# Patient Record
Sex: Male | Born: 1965 | Race: White | Hispanic: No | Marital: Single | Smoking: Former smoker
Health system: Southern US, Community
[De-identification: ages and names within clinical notes are randomized; demographics above are authoritative.]

## PROBLEM LIST (undated history)

## (undated) DIAGNOSIS — F250 Schizoaffective disorder, bipolar type: Secondary | ICD-10-CM

## (undated) DIAGNOSIS — J45909 Unspecified asthma, uncomplicated: Secondary | ICD-10-CM

## (undated) DIAGNOSIS — F32A Depression, unspecified: Secondary | ICD-10-CM

## (undated) DIAGNOSIS — F319 Bipolar disorder, unspecified: Secondary | ICD-10-CM

## (undated) DIAGNOSIS — K769 Liver disease, unspecified: Secondary | ICD-10-CM

## (undated) DIAGNOSIS — F419 Anxiety disorder, unspecified: Secondary | ICD-10-CM

## (undated) DIAGNOSIS — F329 Major depressive disorder, single episode, unspecified: Secondary | ICD-10-CM

## (undated) DIAGNOSIS — F259 Schizoaffective disorder, unspecified: Secondary | ICD-10-CM

## (undated) DIAGNOSIS — E7800 Pure hypercholesterolemia, unspecified: Secondary | ICD-10-CM

## (undated) DIAGNOSIS — C801 Malignant (primary) neoplasm, unspecified: Secondary | ICD-10-CM

## (undated) DIAGNOSIS — K219 Gastro-esophageal reflux disease without esophagitis: Secondary | ICD-10-CM

## (undated) HISTORY — DX: Liver disease, unspecified: K76.9

## (undated) HISTORY — DX: Anxiety disorder, unspecified: F41.9

## (undated) HISTORY — DX: Gastro-esophageal reflux disease without esophagitis: K21.9

## (undated) HISTORY — DX: Major depressive disorder, single episode, unspecified: F32.9

## (undated) HISTORY — DX: Depression, unspecified: F32.A

## (undated) HISTORY — PX: CHEST WALL BIOPSY: SHX1338

## (undated) HISTORY — DX: Unspecified asthma, uncomplicated: J45.909

---

## 1999-12-28 ENCOUNTER — Inpatient Hospital Stay (HOSPITAL_COMMUNITY): Admission: EM | Admit: 1999-12-28 | Discharge: 1999-12-28 | Payer: Self-pay | Admitting: Emergency Medicine

## 1999-12-28 ENCOUNTER — Encounter: Payer: Self-pay | Admitting: Emergency Medicine

## 1999-12-28 ENCOUNTER — Encounter: Payer: Self-pay | Admitting: Cardiology

## 2000-01-31 ENCOUNTER — Emergency Department (HOSPITAL_COMMUNITY): Admission: EM | Admit: 2000-01-31 | Discharge: 2000-01-31 | Payer: Self-pay | Admitting: Emergency Medicine

## 2000-02-09 ENCOUNTER — Encounter: Admission: RE | Admit: 2000-02-09 | Discharge: 2000-02-09 | Payer: Self-pay | Admitting: Hematology and Oncology

## 2000-03-21 ENCOUNTER — Emergency Department (HOSPITAL_COMMUNITY): Admission: EM | Admit: 2000-03-21 | Discharge: 2000-03-21 | Payer: Self-pay | Admitting: Emergency Medicine

## 2000-04-11 ENCOUNTER — Emergency Department (HOSPITAL_COMMUNITY): Admission: EM | Admit: 2000-04-11 | Discharge: 2000-04-11 | Payer: Self-pay | Admitting: Emergency Medicine

## 2000-09-03 ENCOUNTER — Emergency Department (HOSPITAL_COMMUNITY): Admission: EM | Admit: 2000-09-03 | Discharge: 2000-09-03 | Payer: Self-pay | Admitting: Emergency Medicine

## 2000-09-03 ENCOUNTER — Encounter: Payer: Self-pay | Admitting: Emergency Medicine

## 2000-09-13 ENCOUNTER — Emergency Department (HOSPITAL_COMMUNITY): Admission: EM | Admit: 2000-09-13 | Discharge: 2000-09-13 | Payer: Self-pay | Admitting: Emergency Medicine

## 2014-11-07 ENCOUNTER — Emergency Department
Admission: EM | Admit: 2014-11-07 | Discharge: 2014-11-07 | Disposition: A | Discharge status: Home / Self Care | Payer: Medicaid Other | Attending: Student | Admitting: Student

## 2014-11-07 ENCOUNTER — Emergency Department: Payer: Medicaid Other

## 2014-11-07 ENCOUNTER — Encounter: Payer: Self-pay | Admitting: Emergency Medicine

## 2014-11-07 DIAGNOSIS — S20212A Contusion of left front wall of thorax, initial encounter: Secondary | ICD-10-CM | POA: Insufficient documentation

## 2014-11-07 DIAGNOSIS — Y9389 Activity, other specified: Secondary | ICD-10-CM | POA: Insufficient documentation

## 2014-11-07 DIAGNOSIS — Z720 Tobacco use: Secondary | ICD-10-CM | POA: Diagnosis not present

## 2014-11-07 DIAGNOSIS — Y998 Other external cause status: Secondary | ICD-10-CM | POA: Diagnosis not present

## 2014-11-07 DIAGNOSIS — W01198A Fall on same level from slipping, tripping and stumbling with subsequent striking against other object, initial encounter: Secondary | ICD-10-CM | POA: Diagnosis not present

## 2014-11-07 DIAGNOSIS — S299XXA Unspecified injury of thorax, initial encounter: Secondary | ICD-10-CM | POA: Diagnosis present

## 2014-11-07 DIAGNOSIS — Y9289 Other specified places as the place of occurrence of the external cause: Secondary | ICD-10-CM | POA: Insufficient documentation

## 2014-11-07 DIAGNOSIS — Z79899 Other long term (current) drug therapy: Secondary | ICD-10-CM | POA: Insufficient documentation

## 2014-11-07 HISTORY — DX: Bipolar disorder, unspecified: F31.9

## 2014-11-07 HISTORY — DX: Schizoaffective disorder, unspecified: F25.9

## 2014-11-07 HISTORY — DX: Schizoaffective disorder, bipolar type: F25.0

## 2014-11-07 HISTORY — DX: Pure hypercholesterolemia, unspecified: E78.00

## 2014-11-07 MED ORDER — IBUPROFEN 800 MG PO TABS: 800.0000 mg | ORAL_TABLET | Freq: Three times a day (TID) | ORAL | Status: AC | PRN

## 2014-11-07 MED ORDER — TRAMADOL HCL 50 MG PO TABS: 50.0000 mg | ORAL_TABLET | Freq: Four times a day (QID) | ORAL | Status: AC | PRN

## 2014-11-07 NOTE — ED Notes (Signed)
Fell 3 days ago  Hit left rib area pain worse with inspiration

## 2014-11-07 NOTE — Discharge Instructions (Signed)

## 2014-11-07 NOTE — ED Provider Notes (Signed)
[facility] Emergency Department Provider Note    ____________________________________________  Time seen: 1020 hrs.  I have reviewed the triage vital signs and the nursing notes.   HISTORY  Chief Complaint Fall       HPI Shane Holland is a 49 y.o. male complaining the left anterior and lateral rib pain secondary to a fall 3 days ago. Patient stated he was standing on a dumpster and he stated fell striking his anterior and lateral chest against the wall of the dumpster. Patient stated he straighten his pain as 8/10 especially with deep respirations. Patient is  pain is sharp. Patient patient denies any recent surgery.Patient stated only provocative factor is taking deep breaths. Patient denied any dyspnea.     Past Medical History  Diagnosis Date  . Bipolar 1 disorder unk  . Schizo affective schizophrenia unk  . Hypercholesteremia unk    There are no active problems to display for this patient.   History reviewed. No pertinent past surgical history.  Current Outpatient Rx  Name  Route  Sig  Dispense  Refill  . simvastatin (ZOCOR) 20 MG tablet   Oral   Take 20 mg by mouth daily.         Marland Kitchen thiothixene (NAVANE) 5 MG capsule   Oral   Take 5 mg by mouth 3 (three) times daily.         . traZODone (DESYREL) 100 MG tablet   Oral   Take 100 mg by mouth at bedtime.         Marland Kitchen zolpidem (AMBIEN) 10 MG tablet   Oral   Take 10 mg by mouth at bedtime as needed for sleep.         Marland Kitchen ibuprofen (ADVIL,MOTRIN) 800 MG tablet   Oral   Take 1 tablet (800 mg total) by mouth every 8 (eight) hours as needed for moderate pain.   15 tablet   0   . traMADol (ULTRAM) 50 MG tablet   Oral   Take 1 tablet (50 mg total) by mouth every 6 (six) hours as needed for moderate pain.   12 tablet   0     Allergies Review of patient's allergies indicates no known allergies.  No family history on file.  Social History History  Substance Use Topics   . Smoking status: Current Every Day Smoker  . Smokeless tobacco: Not on file  . Alcohol Use: No    Review of Systems  Constitutional: Negative for fever. Eyes: Negative for visual changes. ENT: Negative for sore throat. Cardiovascular: Positive for chest wall pain. Respiratory: Negative for shortness of breath. Gastrointestinal: Negative for abdominal pain, vomiting and diarrhea. Genitourinary: Negative for dysuria. Musculoskeletal: Negative for back pain. Skin: Negative for rash. Neurological: Negative for headaches, focal weakness or numbness. Psychiatric:Positive for bipolar schizophrenia. Endocrine:Negative Hematological/Lymphatic:Negative Allergic/Immunilogical: Negative  10-point ROS otherwise negative.  ____________________________________________   PHYSICAL EXAM:  VITAL SIGNS: ED Triage Vitals  Enc Vitals Group     BP 11/07/14 0953 148/77 mmHg     Pulse Rate 11/07/14 0953 67     Resp 11/07/14 0953 18     Temp 11/07/14 0953 98.5 F (36.9 C)     Temp Source 11/07/14 0953 Oral     SpO2 11/07/14 0953 99 %     Weight 11/07/14 0953 180 lb (81.647 kg)     Height 11/07/14 0953 5\' 8"  (1.727 m)     Head Cir --      Peak Flow --  Pain Score 11/07/14 0953 8     Pain Loc --      Pain Edu? --      Excl. in [place]? --      Constitutional: Alert and oriented. Well appearing and in no distress. Eyes: Conjunctivae are normal. PERRL. Normal extraocular movements. ENT   Head: Normocephalic and atraumatic.   Nose: No congestion/rhinnorhea.   Mouth/Throat: Mucous membranes are moist.   Neck: No stridor. Hematological/Lymphatic/Immunilogical: No cervical lymphadenopathy. Cardiovascular: Normal rate, regular rhythm. Normal and symmetric distal pulses are present in all extremities. No murmurs, rubs, or gallops. Respiratory: Normal respiratory effort without tachypnea nor retractions. Breath sounds are clear and equal bilaterally. No  wheezes/rales/rhonchi. Gastrointestinal: Soft and nontender. No distention. No abdominal bruits. There is no CVA tenderness. Genitourinary: Not examined Musculoskeletal: Nontender with normal range of motion in all extremities. No joint effusions.  No lower extremity tenderness nor edema. Palpation anterior and lateral chest wall. Neurologic:  Normal speech and language. No gross focal neurologic deficits are appreciated. Speech is normal. No gait instability. Skin:  Skin is warm, dry and intact. No rash noted. Psychiatric: Mood and affect are normal. Speech and behavior are normal. Patient exhibits appropriate insight and judgment.  ____________________________________________    LABS (pertinent positives/negatives)    ____________________________________________   EKG  None  ____________________________________________    RADIOLOGY  None  ____________________________________________   PROCEDURES  Procedure(s) performed: None  Critical Care performed: No  ____________________________________________   INITIAL IMPRESSION / ASSESSMENT AND PLAN / ED COURSE  Pertinent labs & imaging results that were available during my care of the patient were reviewed by me and considered in my medical decision making (see chart for details).  Negative Rib Fracture  ____________________________________________   FINAL CLINICAL IMPRESSION(S) / ED DIAGNOSES  Final diagnoses:  Rib contusion, left, initial encounter     Sable Feil, PA-C 11/07/14 [place], MD 11/07/14 2321

## 2017-07-25 LAB — LIPID PANEL
Cholesterol: 178 (ref 0–200)
HDL: 58 (ref 35–70)
LDL CALC: 101
TRIGLYCERIDES: 95 (ref 40–160)

## 2017-07-25 LAB — BASIC METABOLIC PANEL
BUN: 16 (ref 4–21)
Creatinine: 1.3 (ref 0.6–1.3)
Glucose: 107
POTASSIUM: 4.3 (ref 3.4–5.3)
SODIUM: 136 — AB (ref 137–147)

## 2017-07-25 LAB — VITAMIN D 25 HYDROXY (VIT D DEFICIENCY, FRACTURES): Vit D, 25-Hydroxy: 26

## 2017-07-25 LAB — CBC AND DIFFERENTIAL
HCT: 38 — AB (ref 41–53)
HEMOGLOBIN: 13.2 — AB (ref 13.5–17.5)
Neutrophils Absolute: 5023
Platelets: 216 (ref 150–399)
WBC: 6.9

## 2017-07-25 LAB — OTHER LAB RESULT
ALBUMIN: 5.3 — AB (ref 3.6–5.1)
FOLATE: 7.9
VITAMIN B12: 413

## 2017-07-25 LAB — HEPATIC FUNCTION PANEL
ALT: 44 — AB (ref 10–40)
AST: 62 — AB (ref 14–40)
Alkaline Phosphatase: 66 (ref 25–125)
BILIRUBIN, TOTAL: 0.6

## 2017-07-25 LAB — TSH: TSH: 20.84 — AB (ref 0.41–5.90)

## 2017-10-03 ENCOUNTER — Encounter: Payer: Self-pay | Admitting: Family Medicine

## 2017-10-03 ENCOUNTER — Ambulatory Visit (INDEPENDENT_AMBULATORY_CARE_PROVIDER_SITE_OTHER): Payer: Medicaid Other | Admitting: Family Medicine

## 2017-10-03 VITALS — BP 127/70 | HR 96 | Temp 98.4°F | Resp 16 | Ht 68.0 in | Wt 175.0 lb

## 2017-10-03 DIAGNOSIS — F5104 Psychophysiologic insomnia: Secondary | ICD-10-CM

## 2017-10-03 DIAGNOSIS — Z7689 Persons encountering health services in other specified circumstances: Secondary | ICD-10-CM | POA: Diagnosis not present

## 2017-10-03 DIAGNOSIS — F259 Schizoaffective disorder, unspecified: Secondary | ICD-10-CM

## 2017-10-03 DIAGNOSIS — E7800 Pure hypercholesterolemia, unspecified: Secondary | ICD-10-CM | POA: Insufficient documentation

## 2017-10-03 DIAGNOSIS — C7989 Secondary malignant neoplasm of other specified sites: Secondary | ICD-10-CM | POA: Insufficient documentation

## 2017-10-03 DIAGNOSIS — Z515 Encounter for palliative care: Secondary | ICD-10-CM | POA: Diagnosis not present

## 2017-10-03 DIAGNOSIS — F250 Schizoaffective disorder, bipolar type: Secondary | ICD-10-CM | POA: Diagnosis not present

## 2017-10-03 DIAGNOSIS — G4700 Insomnia, unspecified: Secondary | ICD-10-CM | POA: Insufficient documentation

## 2017-10-03 NOTE — Progress Notes (Addendum)
Subjective:    Patient ID: Shane Holland, male    DOB: 1966-03-12, 52 y.o.   MRN: [id number]  Shane Holland is a 52 y.o. male presenting on 10/03/2017 for Establish Care; Schizophrenia; and Cancer (Thymus, metastatic)  History provided by both patient and his sister, primary caregiver, Renee. About 1.5 months ago, he has been moved out of [place] in [facility], this was done when Hospice was called in and established with him. Here to establish care locally with new PCP.  Previous PCP Dr Kathaleen Grinder  HPI   Schizophrenia, schizoaffective type / Bipolar Depression Chronic history of mental health problems. Followed by Psychiatry at previous location. Has been on anti-psychotic medicine with Thiothixene, additionally has been on Trazodone for sleep. There were issues in past with difficulty obtaining Thiothixene in past at group home, had been off for few months. - He has recently attempted to re-establish with [facility] locally in [place], he had been in contact with them and had apt set up recently but then unable to establish since they were waiting on prior psych records, they were given temporary rx Thiothixene (Navane) 5mg  TID for past 2 weeks. Next apt in 1 week. - Additionally regarding medications, he is taking Trazodone nightly for insomnia, but it seems to be ineffective, he reports past meds have not been helpful for insomnia, past record shows had tried Ambien 10mg  in past - Today regarding symptoms, he has no new complaints. Consistent with outside records provided and input from family caregiver, he complains of some delusions that seem to be long-term issues for patient. He recounts same concerns today of the following concerns of his: he states that he "had his heart removed from his body through his bottom, and it was replaced with someone else's heart and now he cannot hear his own thoughts anymore, and he is requesting to have a new heart", additionally he  complains of "having urine and feces inside his body that keep him awake and cause him to have insomnia, and explains this is why the medicines don't work"   Hospice / [place], Thymus Cancer, Stage IV Metastatic Previously followed by Dr Einar Pheasant ([place]) was diagnosed in 2017, he has had prior work-up imaging, treatment with chemotherapy. He was mets to liver, bone, and lungs. He was recently admitted to hospice and palliative care, and then no longer plans to follow-up with Oncology since relocating from [facility] [place] now to [facility]. He is on comfort care. - Currently receiving weekly hospice visit from [facility], he was told they can continue services weekly until he re-establishes with new group home locally then transfer to [facility]. He is on anti-anxiety med with Ativan - He is taking Gabapentin 600mg  BID  HYPERLIPIDEMIA: - Reports concerns if needs to continue statin, was told to discuss it with doctor but hey may not need anymore. Last lipid panel 07/2017, from prior PCP, reviewed result with controlled lipids on statin - Currently taking Simvastatin 20mg  daily, tolerating well without side effects or myalgias - Taking Fish Oil Omega 3, asking if needs this as well   Depression screen PHQ 2/9 10/04/2017  Decreased Interest 2  Down, Depressed, Hopeless 1  PHQ - 2 Score 3  Altered sleeping 3  Tired, decreased energy 2  Change in appetite 1  Feeling bad or failure about yourself  1  Trouble concentrating 2  Moving slowly or fidgety/restless 3  Suicidal  thoughts 0  PHQ-9 Score 15  Difficult doing work/chores Somewhat difficult    GAD 7 : Generalized Anxiety Score 10/04/2017  Nervous, Anxious, on Edge 3  Control/stop worrying 2  Worry too much - different things 2  Trouble relaxing 3  Restless 2  Easily annoyed or irritable 1  Afraid - awful might happen 1  Total GAD 7 Score 14  Anxiety Difficulty Somewhat difficult      Past Medical History:  Diagnosis Date  . Anxiety   . Asthma   . Bipolar 1 disorder ([place]) unk  . Depression   . GERD (gastroesophageal reflux disease)   . Hypercholesteremia unk  . Liver disease   . Schizo affective schizophrenia ([place]) unk   Past Surgical History:  Procedure Laterality Date  . CHEST WALL BIOPSY     Social History   Socioeconomic History  . Marital status: Single    Spouse name: Not on file  . Number of children: Not on file  . Years of education: Not on file  . Highest education level: Not on file  Occupational History  . Not on file  Social Needs  . Financial resource strain: Not on file  . Food insecurity:    Worry: Not on file    Inability: Not on file  . Transportation needs:    Medical: Not on file    Non-medical: Not on file  Tobacco Use  . Smoking status: Current Every Day Smoker    Packs/day: 1.00  . Smokeless tobacco: Current User  Substance and Sexual Activity  . Alcohol use: Yes    Comment: in past  . Drug use: Not Currently    Types: "Crack" cocaine, Marijuana    Comment: in past  . Sexual activity: Not on file  Lifestyle  . Physical activity:    Days per week: Not on file    Minutes per session: Not on file  . Stress: Not on file  Relationships  . Social connections:    Talks on phone: Not on file    Gets together: Not on file    Attends religious service: Not on file    Active member of club or organization: Not on file    Attends meetings of clubs or organizations: Not on file    Relationship status: Not on file  . Intimate partner violence:    Fear of current or ex partner: Not on file    Emotionally abused: Not on file    Physically abused: Not on file    Forced sexual activity: Not on file  Other Topics Concern  . Not on file  Social History Narrative  . Not on file   Family History  Problem Relation Age of Onset  . Hypertension Mother   . Diabetes Sister    Current Outpatient Medications on File Prior  to Visit  Medication Sig  . DOCOSAHEXAENOIC ACID PO Take by mouth.  . gabapentin (NEURONTIN) 300 MG capsule Take 300 mg by mouth 3 (three) times daily.  Marland Kitchen LORazepam (ATIVAN) 0.5 MG tablet Take 0.5 mg by mouth every 4 (four) hours as needed for anxiety.  . ondansetron (ZOFRAN-ODT) 4 MG disintegrating tablet Take by mouth.  . thiothixene (NAVANE) 5 MG capsule Take 5 mg by mouth 3 (three) times daily.  . traZODone (DESYREL) 100 MG tablet Take 100 mg by mouth at bedtime.   No current facility-administered medications on file prior to visit.     Review of Systems  Constitutional: Positive for unexpected  weight change (wt loss due to cancer). Negative for activity change, appetite change, chills, diaphoresis, fatigue and fever.  HENT: Negative for congestion, hearing loss and sinus pressure.   Eyes: Negative for visual disturbance.  Respiratory: Negative for apnea, cough, chest tightness, shortness of breath and wheezing.   Cardiovascular: Negative for chest pain, palpitations and leg swelling.  Gastrointestinal: Negative for abdominal pain, anal bleeding, blood in stool, constipation, diarrhea, nausea and vomiting.  Endocrine: Negative for cold intolerance.  Genitourinary: Negative for difficulty urinating, dysuria, frequency and hematuria.  Musculoskeletal: Negative for arthralgias and neck pain.  Skin: Negative for rash.  Allergic/Immunologic: Negative for environmental allergies.  Neurological: Negative for dizziness, weakness, light-headedness, numbness and headaches.  Hematological: Negative for adenopathy.  Psychiatric/Behavioral: Positive for dysphoric mood and sleep disturbance. Negative for behavioral problems, self-injury and suicidal ideas. The patient is nervous/anxious.    Per HPI unless specifically indicated above     Objective:    BP 127/70   Pulse 96   Temp 98.4 F (36.9 C) (Oral)   Resp 16   Ht 5\' 8"  (1.727 m)   Wt 175 lb (79.4 kg)   BMI 26.61 kg/m   Wt Readings  from Last 3 Encounters:  10/03/17 175 lb (79.4 kg)  11/07/14 180 lb (81.6 kg)    Physical Exam  Constitutional: He is oriented to person, place, and time. He appears well-developed and well-nourished. No distress.  Well-appearing, comfortable, cooperative  HENT:  Head: Normocephalic and atraumatic.  Mouth/Throat: Oropharynx is clear and moist.  Eyes: Conjunctivae are normal. Right eye exhibits no discharge. Left eye exhibits no discharge.  Neck: Normal range of motion. Neck supple.  Cardiovascular: Normal rate, regular rhythm, normal heart sounds and intact distal pulses.  No murmur heard. Pulmonary/Chest: Effort normal.  Musculoskeletal: Normal range of motion. He exhibits no edema.  Neurological: He is alert and oriented to person, place, and time.  Skin: Skin is warm and dry. No rash noted. He is not diaphoretic. No erythema.  Chest wall with incisional scar anteriorly in midline in area of thymus from prior treatment.  Psychiatric:  Well groomed, good eye contact, normal speech but some specific chronic abnormal thoughts that he has reported to family and other providers (See HPI), no new hallucinations or abnormal perceptions. Occasionally speech can be pressured. Responds appropriately and follows commands.  Nursing note and vitals reviewed.  No results found for this or any previous visit.    Assessment & Plan:   Problem List Items Addressed This Visit    Hospice care patient    Diagnosis: metastatic thymus cancer [facility], until relocates to new group home in [place] then transfer      Insomnia    Poorly controlled chronic problem, seems secondary to mental health problems with schizoaffective disorder - Recommend to continue anti psychotic med - Follow-up with [facility] as planned, discuss insomnia further, may consider alternative med, such as Mirtazapine if has not tried alrdy may help wt loss, and may decide to adjust or discontinue Trazodone if  ineffective      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 [facility])    Concern with progressive metastatic thymus cancer S/p prior chemo [place], now on hospice / palliative care, not re-establishing with hospice Continue with [facility] until relocates new group home locally then will transfer to [facility] - Has recent updated FL2 from prior PCP 09/12/17 available      Relevant Medications  ondansetron (ZOFRAN-ODT) 4 MG disintegrating tablet   LORazepam (ATIVAN) 0.5 MG tablet   Schizo affective schizophrenia ([place]) - Primary    Chronic mental health problem with additional complications of mood/anxiety/insomnia Followed by Psychiatry previously, now re-establishing with [facility] locally, apt in 1 week, has current rx anti-psychotic Thiothixine continue this for now, defer meds to Psych - Complicated with fixed delusions about body such as removal of his heart replace with other, and urine inside his body causing insomnia       Other Visit Diagnoses    Encounter to establish care with new doctor     Requested and reviewed outside records from prior PCP, Oncology Review current hospice records upon receipt, anticipate will need transfer to [place] prior completed copy of FL2 from prior PCP from 09/12/17, to be scanned into chart - Will update FL2 at request, pending review from Psych and any med changes      Hypercholesteremia   Previously controlled lipids last panel 07/2017 On statin Reviewed med list today and recommended that he no longer needs to take Simvastatin 20mg , since concerns with on hospice and life expectancy - Discontinued Simvastatin and Omega 3 fish oil, reduce pill burden and reduce med side effects possibliity        No orders of the defined types were placed in this encounter.   Follow up plan: Return in about 6 weeks (around 11/14/2017) for Follow-up Psychiatry / Group Home / Hospice.  Nobie Putnam, [facility] 10/04/2017, 8:13 AM

## 2017-10-03 NOTE — Assessment & Plan Note (Addendum)
Chronic mental health problem with additional complications of mood/anxiety/insomnia Followed by Psychiatry previously, now re-establishing with [facility] locally, apt in 1 week, has current rx anti-psychotic Thiothixine continue this for now, defer meds to Psych - Complicated with fixed delusions about body such as removal of his heart replace with other, and urine inside his body causing insomnia

## 2017-10-03 NOTE — Assessment & Plan Note (Signed)
Previously controlled lipids last panel 07/2017 On statin Reviewed med list today and recommended that he no longer needs to take Simvastatin 20mg , since concerns with on hospice and life expectancy - Discontinued Simvastatin and Omega 3 fish oil, reduce pill burden and reduce med side effects possibliity

## 2017-10-03 NOTE — Assessment & Plan Note (Signed)
Poorly controlled chronic problem, seems secondary to mental health problems with schizoaffective disorder - Recommend to continue anti psychotic med - Follow-up with [facility] as planned, discuss insomnia further, may consider alternative med, such as Mirtazapine if has not tried alrdy may help wt loss, and may decide to adjust or discontinue Trazodone if ineffective

## 2017-10-03 NOTE — Assessment & Plan Note (Signed)
Diagnosis: metastatic thymus cancer [facility], until relocates to new group home in [place] then transfer

## 2017-10-03 NOTE — Assessment & Plan Note (Signed)
Concern with progressive metastatic thymus cancer S/p prior chemo and treatment [place], now on hospice / palliative care, not re-establishing with hospice Continue with [facility] until relocates new group home locally then will transfer to [facility] - Has recent updated FL2 from prior PCP 09/12/17 available

## 2017-10-03 NOTE — Patient Instructions (Addendum)
Thank you for coming to the office today.  Follow-up with [facility] and Hospice as planned  Recommend to STOP taking Simvastatin and Fish Oil cholesterol pills  May consider the Trazodone otherwise if not helping - discuss with Psychiatry [facility]  We will update the FL2 - once we receive fax copy from [place] with medication updated  Please schedule a Follow-up Appointment to: Return in about 6 weeks (around 11/14/2017) for Follow-up Psychiatry / Group Home / Hospice.  If you have any other questions or concerns, please feel free to call the office or send a message through [place]. You may also schedule an earlier appointment if necessary.  Additionally, you may be receiving a survey about your experience at our office within a few days to 1 week by e-mail or mail. We value your feedback.  Nobie Putnam, DO [place]

## 2017-10-04 ENCOUNTER — Ambulatory Visit (INDEPENDENT_AMBULATORY_CARE_PROVIDER_SITE_OTHER): Payer: Medicaid Other

## 2017-10-04 ENCOUNTER — Encounter: Payer: Self-pay | Admitting: Family Medicine

## 2017-10-04 DIAGNOSIS — Z111 Encounter for screening for respiratory tuberculosis: Secondary | ICD-10-CM | POA: Diagnosis not present

## 2017-10-06 ENCOUNTER — Other Ambulatory Visit: Payer: Self-pay

## 2017-10-06 LAB — TB SKIN TEST
Induration: 0 mm
TB SKIN TEST: NEGATIVE

## 2017-10-12 ENCOUNTER — Telehealth: Payer: Self-pay

## 2017-10-12 NOTE — Telephone Encounter (Signed)
Called back, I did not reach Dr Jamse Arn. But spoke with [facility] staff, who will leave message.  I reported that I agree with current switch of med and treatment plan and will follow-up on this.  Regarding the question on if he is to resume Oncology therapy for his cancer, I am not sure. This is opposite of what I was told in the office last visit. They told me that since he moved from [place], and left his previous Oncologist they were told nothing else available and they would not be re-establishing with an Oncologist locally. They were planning to continue Hospice care / Palliative instead.  I will look into this further, and patient is scheduled to return within 1 month.  I do not need call back specifically from [place]. Just need a faxed copy of their last office visit note to enter into patient's chart.  Nobie Putnam, [facility] 10/12/2017, 4:41 PM

## 2017-10-12 NOTE — Telephone Encounter (Signed)
Floria Raveling. MD (Psychitary) from [facility] called to discuss her treatment plan for the pt. She states since the medication Carolyne Littles  is not available. She will put the patient on a low dose of Abilify 2 MG once daily and increase it accordingly. She discuss the risk with the patient about starting on a medication how that could effect his liver.  He felt like treating his mental conditions was more beneficial to him.    She also stated the patient informed her he would like further evaluations for his cancer treatment. He didn't understand why it was documented in his chart per Hospice that he had declined treatment for his cancer.   Gypsy Balsam will be out of the office on Friday, but will return on Monday if you would like to f/u with her. She also stated if you need to reach her before Monday you can contact her nurse Santiago Glad.  Floria Raveling, MD [phone]

## 2017-11-01 ENCOUNTER — Ambulatory Visit (INDEPENDENT_AMBULATORY_CARE_PROVIDER_SITE_OTHER): Payer: Medicaid Other | Admitting: Family Medicine

## 2017-11-01 ENCOUNTER — Encounter: Payer: Self-pay | Admitting: Family Medicine

## 2017-11-01 VITALS — BP 122/88 | HR 118 | Temp 98.2°F | Resp 16 | Ht 68.0 in | Wt 172.0 lb

## 2017-11-01 DIAGNOSIS — C7989 Secondary malignant neoplasm of other specified sites: Secondary | ICD-10-CM | POA: Diagnosis not present

## 2017-11-01 DIAGNOSIS — Z515 Encounter for palliative care: Secondary | ICD-10-CM

## 2017-11-01 NOTE — Patient Instructions (Addendum)
Thank you for coming to the office today.  We will contact [facility] for more general information  Stay tuned for more information  As discussed I am concerned about your stage IV cancer and that this is a terminal illness, we will discuss about options for you to help improve your breathing and see if we need any imaging at this time. Hospice will likely be the one to assist with this.  Please schedule a Follow-up Appointment to: Return if symptoms worsen or fail to improve.  If you have any other questions or concerns, please feel free to call the office or send a message through [place]. You may also schedule an earlier appointment if necessary.  Additionally, you may be receiving a survey about your experience at our office within a few days to 1 week by e-mail or mail. We value your feedback.  Nobie Putnam, DO [place]

## 2017-11-01 NOTE — Progress Notes (Signed)
Subjective:    Patient ID: Shane Holland, male    DOB: 06-27-1966, 52 y.o.   MRN: [id number]  Shane Holland is a 52 y.o. male presenting on 11/01/2017 for Shortness of Breath (hospice home care wants patient to get evaluated for scan for tumor ) and Cancer  History provided both by patient, and also primarily by his sister, and primary caregiver, Renee.  He is now currently residing in [facility] in [place]  HPI   Metastatic Cancer of Thymus (Stage IV) / Hospice Patient - Last visit with me 10/03/17, for initial visit for establish care reviewed same primary problem, see prior notes for background information. - Interval update with now he has relocated to new group home in [place]. He has transferred from [facility] to [facility]. - Today patient complains of some worsening shortness of breath. He continues on his symptomatic treatments per hospice including Ativan and Morphine injections PRN, with some improvement, but still complains of dyspnea, worse with exertion. He is on supplemental O2 4.5L continuous. - He is requesting imaging for abdomen to check on status of tumors, last imaging was CT Chest in 08/2017 by previous Oncology - He is asking about treatment and cure of his cancer, however previously he has acknowledged no longer plans to f/u with Oncology as he is on hospice - His sister states that he has not been told that his cancer is terminal - Denies hemoptysis, chest pain or tightness, productive cough, wheezing, syncope or pre syncope   Depression screen [facility] 2/9 10/04/2017  Decreased Interest 2  Down, Depressed, Hopeless 1  PHQ - 2 Score 3  Altered sleeping 3  Tired, decreased energy 2  Change in appetite 1  Feeling bad or failure about yourself  1  Trouble concentrating 2  Moving slowly or fidgety/restless 3  Suicidal thoughts 0  PHQ-9 Score 15  Difficult doing work/chores Somewhat difficult    Social History    Tobacco Use  . Smoking status: Former Smoker    Packs/day: 1.00    Last attempt to quit: 10/28/2017    Years since quitting: 0.0  . Smokeless tobacco: Current User  Substance Use Topics  . Alcohol use: Yes    Comment: in past  . Drug use: Not Currently    Types: "Crack" cocaine, Marijuana    Comment: in past    Review of Systems Per HPI unless specifically indicated above     Objective:    BP 122/88   Pulse (!) 118   Temp 98.2 F (36.8 C) (Oral)   Resp 16   Ht 5\' 8"  (1.727 m)   Wt 172 lb (78 kg)   SpO2 99% Comment: patient is on 4.5 liter of oxygen  BMI 26.15 kg/m   Wt Readings from Last 3 Encounters:  11/01/17 172 lb (78 kg)  10/03/17 175 lb (79.4 kg)  11/07/14 180 lb (81.6 kg)    Physical Exam  Constitutional: He is oriented to person, place, and time. No distress.  Currently mildly ill and tired appearing, relatively comfortable on oxygen tank, cooperative, weight loss within past 1 month  HENT:  Head: Normocephalic and atraumatic.  Mouth/Throat: Oropharynx is clear and moist.  Eyes: Conjunctivae are normal. Right eye exhibits no discharge. Left eye exhibits no discharge.  Neck: Normal range of motion. Neck supple.  Cardiovascular: Regular rhythm, normal heart sounds and intact distal pulses.  No murmur heard. tachycardic  Pulmonary/Chest: Effort normal. No  respiratory distress. He has decreased breath sounds in the left middle field and the left lower field. He has rhonchi in the right lower field.  4.5 L via O2 tank, [place]  Musculoskeletal: Normal range of motion. He exhibits no edema.  Neurological: He is alert and oriented to person, place, and time.  Skin: Skin is warm and dry. No rash noted. He is not diaphoretic. No erythema.  Chest wall with incisional scar anteriorly in midline in area of thymus from prior treatment.  Psychiatric:  Well groomed, good eye contact, normal speech has rare abnormal thoughts, no new hallucinations or abnormal perceptions.  Responds appropriately and follows commands. He has poor insight into his current medical condition with cancer  Nursing note and vitals reviewed.  Results for orders placed or performed in visit on 10/04/17  CBC and differential  Result Value Ref Range   Hemoglobin 13.2 (A) 13.5 - 17.5   HCT 38 (A) 41 - 53   Neutrophils Absolute 5,023    Platelets 216 150 - 399   WBC 6.9   VITAMIN D 25 Hydroxy (Vit-D Deficiency, Fractures)  Result Value Ref Range   Vit D, 25-Hydroxy 26   Basic metabolic panel  Result Value Ref Range   Glucose 107    BUN 16 4 - 21   Creatinine 1.3 0.6 - 1.3   Potassium 4.3 3.4 - 5.3   Sodium 136 (A) 137 - 147  Lipid panel  Result Value Ref Range   Triglycerides 95 40 - 160   Cholesterol 178 0 - 200   HDL 58 35 - 70   LDL Cholesterol 101   Hepatic function panel  Result Value Ref Range   Alkaline Phosphatase 66 25 - 125   ALT 44 (A) 10 - 40   AST 62 (A) 14 - 40   Bilirubin, Total 0.6   TSH  Result Value Ref Range   TSH 20.84 (A) 0.41 - 5.90  Other/Misc lab result  Result Value Ref Range   VITAMIN B12 413    Folate 7.9    Albumin 5.3 (A) 3.6 - 5.1      Assessment & Plan:   Problem List Items Addressed This Visit    Hospice care patient   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 (HCC) - Primary   Relevant Medications   prochlorperazine (COMPAZINE) 10 MG tablet      Clinically with progressive metastatic cancer, thymus Stage IV Now more symptomatic with wt loss in 1 month. Progressive dyspnea, on higher O2 requirement continuous Improved temporarily on PRN with Ativan and Morphine per Hospice Patient has poor insight into terminal nature of his cancer, and he is still asking for treatment, however we discussed specifically today that his illness is terminal and our goal is to make him comfortable Now established with [facility], will contact them this week to review plan of care and discuss if any imaging planned or other treatments  available Follow-up as planned in 2 weeks with his Group Home present, reviewed current hospice meds here today  No orders of the defined types were placed in this encounter.   Follow up plan: Return if symptoms worsen or fail to improve.  A total of >25 minutes was spent face-to-face with this patient. Greater than 50% of this time was spent in counseling on his metastatic cancer diagnosis and complications of this sequela and the terminal nature of this condition and coordination of care with the patient, patient's hospice provider liberty to be  contacted and review current plan of care and discuss future concerns regarding possible imaging testing.  Nobie Putnam, DO [facility] 11/02/2017, 1:17 AM

## 2017-11-02 ENCOUNTER — Telehealth: Payer: Self-pay | Admitting: Family Medicine

## 2017-11-02 ENCOUNTER — Inpatient Hospital Stay: Payer: Medicaid Other

## 2017-11-02 ENCOUNTER — Encounter: Payer: Self-pay | Admitting: Family Medicine

## 2017-11-02 ENCOUNTER — Inpatient Hospital Stay
Admission: EM | Admit: 2017-11-02 | Discharge: 2017-11-03 | DRG: 844 | Disposition: A | Discharge status: Hospice | Payer: Medicaid Other | Attending: Internal Medicine | Admitting: Internal Medicine

## 2017-11-02 ENCOUNTER — Other Ambulatory Visit: Payer: Self-pay

## 2017-11-02 ENCOUNTER — Encounter: Payer: Self-pay | Admitting: Emergency Medicine

## 2017-11-02 ENCOUNTER — Emergency Department: Payer: Medicaid Other

## 2017-11-02 DIAGNOSIS — Z9221 Personal history of antineoplastic chemotherapy: Secondary | ICD-10-CM | POA: Diagnosis not present

## 2017-11-02 DIAGNOSIS — Z8249 Family history of ischemic heart disease and other diseases of the circulatory system: Secondary | ICD-10-CM

## 2017-11-02 DIAGNOSIS — Z9981 Dependence on supplemental oxygen: Secondary | ICD-10-CM

## 2017-11-02 DIAGNOSIS — F17290 Nicotine dependence, other tobacco product, uncomplicated: Secondary | ICD-10-CM | POA: Diagnosis present

## 2017-11-02 DIAGNOSIS — J910 Malignant pleural effusion: Secondary | ICD-10-CM | POA: Diagnosis present

## 2017-11-02 DIAGNOSIS — Z66 Do not resuscitate: Secondary | ICD-10-CM | POA: Diagnosis present

## 2017-11-02 DIAGNOSIS — R0602 Shortness of breath: Secondary | ICD-10-CM

## 2017-11-02 DIAGNOSIS — F250 Schizoaffective disorder, bipolar type: Secondary | ICD-10-CM | POA: Diagnosis present

## 2017-11-02 DIAGNOSIS — R0902 Hypoxemia: Secondary | ICD-10-CM | POA: Diagnosis present

## 2017-11-02 DIAGNOSIS — R000 Tachycardia, unspecified: Secondary | ICD-10-CM | POA: Diagnosis present

## 2017-11-02 DIAGNOSIS — K219 Gastro-esophageal reflux disease without esophagitis: Secondary | ICD-10-CM | POA: Diagnosis present

## 2017-11-02 DIAGNOSIS — J90 Pleural effusion, not elsewhere classified: Secondary | ICD-10-CM | POA: Diagnosis present

## 2017-11-02 DIAGNOSIS — Z23 Encounter for immunization: Secondary | ICD-10-CM

## 2017-11-02 DIAGNOSIS — Z833 Family history of diabetes mellitus: Secondary | ICD-10-CM

## 2017-11-02 DIAGNOSIS — Z9889 Other specified postprocedural states: Secondary | ICD-10-CM

## 2017-11-02 DIAGNOSIS — C37 Malignant neoplasm of thymus: Secondary | ICD-10-CM | POA: Diagnosis present

## 2017-11-02 DIAGNOSIS — Z515 Encounter for palliative care: Secondary | ICD-10-CM | POA: Diagnosis present

## 2017-11-02 HISTORY — DX: Malignant (primary) neoplasm, unspecified: C80.1

## 2017-11-02 LAB — CBC WITH DIFFERENTIAL/PLATELET
Basophils Absolute: 0.1 10*3/uL (ref 0–0.1)
Basophils Relative: 1 %
Eosinophils Absolute: 0.1 10*3/uL (ref 0–0.7)
Eosinophils Relative: 1 %
HEMATOCRIT: 38 % — AB (ref 40.0–52.0)
HEMOGLOBIN: 13.2 g/dL (ref 13.0–18.0)
LYMPHS ABS: 0.5 10*3/uL — AB (ref 1.0–3.6)
LYMPHS PCT: 4 %
MCH: 33.1 pg (ref 26.0–34.0)
MCHC: 34.8 g/dL (ref 32.0–36.0)
MCV: 95.2 fL (ref 80.0–100.0)
MONOS PCT: 9 %
Monocytes Absolute: 1.1 10*3/uL — ABNORMAL HIGH (ref 0.2–1.0)
NEUTROS ABS: 10.1 10*3/uL — AB (ref 1.4–6.5)
Neutrophils Relative %: 85 %
Platelets: 303 10*3/uL (ref 150–440)
RBC: 4 MIL/uL — ABNORMAL LOW (ref 4.40–5.90)
RDW: 13 % (ref 11.5–14.5)
WBC: 11.8 10*3/uL — ABNORMAL HIGH (ref 3.8–10.6)

## 2017-11-02 LAB — BODY FLUID CELL COUNT WITH DIFFERENTIAL
EOS FL: 3 %
Lymphs, Fluid: 54 %
MONOCYTE-MACROPHAGE-SEROUS FLUID: 25 %
Neutrophil Count, Fluid: 18 %
Total Nucleated Cell Count, Fluid: 834 cu mm

## 2017-11-02 LAB — ALBUMIN, PLEURAL OR PERITONEAL FLUID: Albumin, Fluid: 2.4 g/dL

## 2017-11-02 LAB — PROTIME-INR
INR: 1.12
PROTHROMBIN TIME: 14.3 s (ref 11.4–15.2)

## 2017-11-02 LAB — BASIC METABOLIC PANEL
Anion gap: 10 (ref 5–15)
BUN: 23 mg/dL — AB (ref 6–20)
CHLORIDE: 91 mmol/L — AB (ref 101–111)
CO2: 28 mmol/L (ref 22–32)
Calcium: 9.1 mg/dL (ref 8.9–10.3)
Creatinine, Ser: 0.75 mg/dL (ref 0.61–1.24)
GFR calc Af Amer: >60 mL/min (ref >60)
GFR calc non Af Amer: >60 mL/min (ref >60)
GLUCOSE: 128 mg/dL — AB (ref 65–99)
POTASSIUM: 4.6 mmol/L (ref 3.5–5.1)
Sodium: 129 mmol/L — ABNORMAL LOW (ref 135–145)

## 2017-11-02 LAB — PROTEIN, PLEURAL OR PERITONEAL FLUID: TOTAL PROTEIN, FLUID: 4.1 g/dL

## 2017-11-02 LAB — TROPONIN I: Troponin I: <0.03 ng/mL

## 2017-11-02 LAB — ALBUMIN: Albumin: 3.1 g/dL — ABNORMAL LOW (ref 3.5–5.0)

## 2017-11-02 LAB — LACTATE DEHYDROGENASE, PLEURAL OR PERITONEAL FLUID: LD FL: 564 U/L — AB (ref 3–23)

## 2017-11-02 LAB — TSH: TSH: 1.309 u[IU]/mL (ref 0.350–4.500)

## 2017-11-02 LAB — LACTATE DEHYDROGENASE: LDH: 294 U/L — ABNORMAL HIGH (ref 98–192)

## 2017-11-02 LAB — APTT: APTT: 35 s (ref 24–36)

## 2017-11-02 MED ORDER — GABAPENTIN 300 MG PO CAPS
300.0000 mg | ORAL_CAPSULE | Freq: Three times a day (TID) | ORAL | Status: DC
  Administered 2017-11-02 – 2017-11-03 (×3): 300 mg via ORAL
  Filled 2017-11-02 (×3): qty 1

## 2017-11-02 MED ORDER — ALBUTEROL SULFATE (2.5 MG/3ML) 0.083% IN NEBU: 2.5000 mg | INHALATION_SOLUTION | RESPIRATORY_TRACT | Status: DC | PRN

## 2017-11-02 MED ORDER — ACETAMINOPHEN 325 MG PO TABS: 650.0000 mg | ORAL_TABLET | Freq: Four times a day (QID) | ORAL | Status: DC | PRN

## 2017-11-02 MED ORDER — ONDANSETRON HCL 4 MG PO TABS: 4.0000 mg | ORAL_TABLET | Freq: Four times a day (QID) | ORAL | Status: DC | PRN

## 2017-11-02 MED ORDER — MORPHINE SULFATE (CONCENTRATE) 10 MG/0.5ML PO SOLN: 5.0000 mg | ORAL | Status: DC | PRN

## 2017-11-02 MED ORDER — ACETAMINOPHEN 650 MG RE SUPP: 650.0000 mg | Freq: Four times a day (QID) | RECTAL | Status: DC | PRN

## 2017-11-02 MED ORDER — METHYLPREDNISOLONE SODIUM SUCC 125 MG IJ SOLR
125.0000 mg | Freq: Once | INTRAMUSCULAR | Status: AC
  Administered 2017-11-02: 125 mg via INTRAVENOUS
  Filled 2017-11-02: qty 2

## 2017-11-02 MED ORDER — POLYETHYLENE GLYCOL 3350 17 G PO PACK: 17.0000 g | PACK | Freq: Every day | ORAL | Status: DC | PRN

## 2017-11-02 MED ORDER — PANTOPRAZOLE SODIUM 40 MG PO TBEC
40.0000 mg | DELAYED_RELEASE_TABLET | Freq: Every day | ORAL | Status: DC
  Administered 2017-11-02 – 2017-11-03 (×2): 40 mg via ORAL
  Filled 2017-11-02 (×2): qty 1

## 2017-11-02 MED ORDER — IPRATROPIUM-ALBUTEROL 0.5-2.5 (3) MG/3ML IN SOLN
3.0000 mL | Freq: Once | RESPIRATORY_TRACT | Status: AC
  Administered 2017-11-02: 3 mL via RESPIRATORY_TRACT

## 2017-11-02 MED ORDER — PNEUMOCOCCAL VAC POLYVALENT 25 MCG/0.5ML IJ INJ
0.5000 mL | INJECTION | INTRAMUSCULAR | Status: AC
  Administered 2017-11-03: 0.5 mL via INTRAMUSCULAR
  Filled 2017-11-02: qty 0.5

## 2017-11-02 MED ORDER — DOCUSATE SODIUM 100 MG PO CAPS
100.0000 mg | ORAL_CAPSULE | Freq: Two times a day (BID) | ORAL | Status: DC
  Administered 2017-11-02 – 2017-11-03 (×2): 100 mg via ORAL
  Filled 2017-11-02 (×2): qty 1

## 2017-11-02 MED ORDER — METAXALONE 800 MG PO TABS
400.0000 mg | ORAL_TABLET | Freq: Three times a day (TID) | ORAL | Status: DC
  Administered 2017-11-02 – 2017-11-03 (×2): 400 mg via ORAL
  Filled 2017-11-02 (×5): qty 0.5

## 2017-11-02 MED ORDER — LORAZEPAM 0.5 MG PO TABS: 0.5000 mg | ORAL_TABLET | ORAL | Status: DC | PRN

## 2017-11-02 MED ORDER — ENOXAPARIN SODIUM 40 MG/0.4ML ~~LOC~~ SOLN
40.0000 mg | SUBCUTANEOUS | Status: DC
  Administered 2017-11-02: 21:00:00 40 mg via SUBCUTANEOUS
  Filled 2017-11-02: qty 0.4

## 2017-11-02 MED ORDER — ONDANSETRON HCL 4 MG/2ML IJ SOLN: 4.0000 mg | Freq: Four times a day (QID) | INTRAMUSCULAR | Status: DC | PRN

## 2017-11-02 MED ORDER — SODIUM CHLORIDE 0.9 % IV BOLUS
1000.0000 mL | Freq: Once | INTRAVENOUS | Status: AC
  Administered 2017-11-02: 1000 mL via INTRAVENOUS

## 2017-11-02 MED ORDER — IPRATROPIUM-ALBUTEROL 0.5-2.5 (3) MG/3ML IN SOLN
RESPIRATORY_TRACT | Status: AC
  Administered 2017-11-02: 3 mL
  Filled 2017-11-02: qty 9

## 2017-11-02 NOTE — Procedures (Addendum)
Interventional Radiology Procedure Note  Procedure: US guided left thoracentesis  Complications: None  Estimated Blood Loss: None  Findings: 2.9 L of grossly bloody fluid removed from left pleural space.  Venetia Night. Kathlene Cote, M.D Pager:  [phone]

## 2017-11-02 NOTE — Care Management (Signed)
Message/H & P sent to [facility] as patient is open to their service. RNCM consult for DME.

## 2017-11-02 NOTE — ED Provider Notes (Signed)
[facility] Emergency Department Provider Note ____________________________________________   First MD Initiated Contact with Patient 11/02/17 [phone]     (approximate)  I have reviewed the triage vital signs and the nursing notes.   HISTORY  Chief Complaint Shortness of Breath    HPI Shane Holland is a 52 y.o. male with PMH as noted below as well as history of metastatic thymic cancer currently on hospice and palliative care who presents with shortness of breath, gradual onset, occurring over the last day, and associated with wheezing.  Patient denies fever, cough, chest pain, or GI symptoms.  He states he has had problems with his O2 at home (he is on 4 L normally).  Past Medical History:  Diagnosis Date  . Anxiety   . Asthma   . Bipolar 1 disorder ([place]) unk  . Depression   . GERD (gastroesophageal reflux disease)   . Hypercholesteremia unk  . Liver disease   . Schizo affective schizophrenia [facility]) unk    Patient Active Problem List   Diagnosis Date Noted  . Schizo affective schizophrenia ([place]) 10/03/2017  . Insomnia 10/03/2017  . 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 ([place]) 10/03/2017  . Hospice care patient 10/03/2017  . Hypercholesteremia 10/03/2017    Past Surgical History:  Procedure Laterality Date  . CHEST WALL BIOPSY      Prior to Admission medications   Medication Sig Start Date End Date Taking? Authorizing Provider  ARIPiprazole (ABILIFY) 2 MG tablet Take 2 mg by mouth daily.    [provider]  DOCOSAHEXAENOIC ACID PO Take by mouth.    [provider]  gabapentin (NEURONTIN) 300 MG capsule Take 300 mg by mouth 3 (three) times daily. 09/26/17 09/26/18  [provider]  Heparin Lock Flush (HEPARIN FLUSH, PORCINE,) 100 UNIT/ML injection Inject into the vein.    [provider]  LORazepam (ATIVAN) 0.5 MG tablet Take 0.5 mg by mouth every 4 (four) hours as needed for anxiety.    [provider]    naproxen sodium (ALEVE) 220 MG tablet Take 220 mg by mouth 2 (two) times daily as needed.    [provider]  Omega-3 1000 MG CAPS Take by mouth.    [provider]  omeprazole (PRILOSEC) 40 MG capsule Take 40 mg by mouth daily.    [provider]  ondansetron (ZOFRAN-ODT) 4 MG disintegrating tablet Take by mouth.    [provider]  prochlorperazine (COMPAZINE) 10 MG tablet Take by mouth.    [provider]  thiothixene (NAVANE) 5 MG capsule Take 5 mg by mouth 3 (three) times daily.    [provider]  traZODone (DESYREL) 100 MG tablet Take 100 mg by mouth at bedtime.    [provider]  zolpidem (AMBIEN) 10 MG tablet Take by mouth.    [provider]    Allergies Patient has no known allergies.  Family History  Problem Relation Age of Onset  . Hypertension Mother   . Diabetes Sister     Social History Social History   Tobacco Use  . Smoking status: Former Smoker    Packs/day: 1.00    Last attempt to quit: 10/28/2017    Years since quitting: 0.0  . Smokeless tobacco: Current User  Substance Use Topics  . Alcohol use: Yes    Comment: in past  . Drug use: Not Currently    Types: "Crack" cocaine, Marijuana    Comment: in past    Review of Systems  Constitutional: No fever. Eyes: No redness. ENT: No sore throat. Cardiovascular: Denies chest pain. Respiratory: Positive for shortness of breath. Gastrointestinal: No vomiting.  No diarrhea.  Genitourinary: Negative for dysuria.  Musculoskeletal: Negative for back pain. Skin: Negative for rash. Neurological: Negative for headache.   ____________________________________________   PHYSICAL EXAM:  VITAL SIGNS: ED Triage Vitals  Enc Vitals Group     BP 11/02/17 0807 104/79     Pulse Rate 11/02/17 0807 (!) 119     Resp 11/02/17 0807 16     Temp 11/02/17 0807 98.2 F (36.8 C)     Temp Source 11/02/17 0807 Oral     SpO2 11/02/17 0807 100 %      Weight 11/02/17 0810 172 lb (78 kg)     Height 11/02/17 0810 5\' 8"  (1.727 m)     Head Circumference --      Peak Flow --      Pain Score 11/02/17 0808 0     Pain Loc --      Pain Edu? --      Excl. in [place]? --     Constitutional: Alert and oriented.  Slightly uncomfortable appearing but in no acute distress. Eyes: Conjunctivae are normal.  Head: Atraumatic. Nose: No congestion/rhinnorhea. Mouth/Throat: Mucous membranes are somewhat dry.   Neck: Normal range of motion.  Cardiovascular: Tachycardic, regular rhythm. Grossly normal heart sounds.  Good peripheral circulation. Respiratory: Slightly increased respiratory effort.  No retractions.  Bilateral wheezing with decreased breath sounds on left. Gastrointestinal: Soft and nontender. No distention.  Genitourinary: No flank tenderness. Musculoskeletal: No lower extremity edema.  Extremities warm and well perfused.  Neurologic:  Normal speech and language. No gross focal neurologic deficits are appreciated.  Skin:  Skin is warm and dry. No rash noted. Psychiatric: Mood and affect are normal. Speech and behavior are normal.  ____________________________________________   LABS (all labs ordered are listed, but only abnormal results are displayed)  Labs Reviewed  BASIC METABOLIC PANEL - Abnormal; Notable for the following components:      Result Value   Sodium 129 (*)    Chloride 91 (*)    Glucose, Bld 128 (*)    BUN 23 (*)    All other components within normal limits  CBC WITH DIFFERENTIAL/PLATELET - Abnormal; Notable for the following components:   WBC 11.8 (*)    RBC 4.00 (*)    HCT 38.0 (*)    Neutro Abs 10.1 (*)    Lymphs Abs 0.5 (*)    Monocytes Absolute 1.1 (*)    All other components within normal limits  TROPONIN I   ____________________________________________  EKG  ED ECG REPORT I, Arta Silence, the attending physician, personally viewed and interpreted this ECG.  Date: 11/02/2017 EKG Time: 803 Rate:  119 Rhythm: Sinus tachycardia QRS Axis: normal Intervals: normal ST/T Wave abnormalities: normal Narrative Interpretation: no evidence of acute ischemia  ____________________________________________  RADIOLOGY  CXR: Total opacity of left lung, atelectasis versus effusion  ____________________________________________   PROCEDURES  Procedure(s) performed: No  Procedures  Critical Care performed: No ____________________________________________   INITIAL IMPRESSION / ASSESSMENT AND PLAN / ED COURSE  Pertinent labs & imaging results that were available during my care of the patient were reviewed by me and considered in my medical decision making (see chart for details).  52 year old male with PMH as noted above including history of metastatic thymic cancer who is currently on hospice and palliative care presents with worsening shortness of breath over the course  the day today despite 4 L of O2 at home.  It is associated with wheezing but no fever.  I reviewed the past medical records in Epic and confirmed the patient's cancer history.  No recent other admissions.  On exam, he is uncomfortable appearing but in no acute respiratory distress.  He has significant bilateral wheezing.  The remainder the exam is as described above.  Differential includes pneumonia, acute bronchitis, pleural effusion, obstruction related to the mass, or less likely cardiac etiology.  Plan: Nebs, steroids, chest x-ray, labs, and reassess.    ----------------------------------------- 10:50 AM on 11/02/2017 -----------------------------------------  Patient reports of improved symptoms after nebs and steroid.  X-ray reveals opacification of his entire left lung, consistent with likely effusion which appears to be new.  Although patient is on hospice given that he is relatively functional I believe that he would benefit from potential thoracentesis as well as continue treatment for likely bronchitis, so  we will admit him to the hospital.  I signed the patient out to the hospitalist Dr. Darvin Neighbours.  ____________________________________________   FINAL CLINICAL IMPRESSION(S) / ED DIAGNOSES  Final diagnoses:  Pleural effusion  Hypoxia  Shortness of breath      NEW MEDICATIONS STARTED DURING THIS VISIT:  New Prescriptions   No medications on file     Note:  This document was prepared using Dragon voice recognition software and may include unintentional dictation errors.    Arta Silence, MD 11/02/17 1050

## 2017-11-02 NOTE — ED Notes (Signed)
Pt taken to floor in [facility] by RN at this time. VSS. Report called to floor. All questions answered.

## 2017-11-02 NOTE — ED Triage Notes (Signed)
Pt to ED with c/o of increased difficulty breathing. Pt is a cancer pt sent by hospice for eval. Pt on chronic O2 at 4 L.

## 2017-11-02 NOTE — Telephone Encounter (Signed)
Received notice today from [facility] ED that patient was seen for worsening dyspnea today and higher O2 req and ultimately had x-ray showed significant L sided pleural effusion. He was admitted to hospitalist for further treatment and likely drainage.  I called patient's sister Joseph Art to review this course, as we just discussed his care yesterday and reviewed prompt follow-up recommendations if his breathing were to worsen, which she has followed.  She updated me that his current [facility] plans to discharge him while in hospital and they would readmit after. She would like to transfer hospice providers to [facility], and also for the benefit of access to the hospice home towards end of his life if needed. She prefers to take him out of group home most likely in future.  I advised her that typically we send outpatient fax/referral for [facility], but since he is inpatient currently, they should be able to discharge him directly into [facility].  I called [facility] and reviewed this with them, and they will reach out to their hospital liaison and coordinate his admission to their hospice service upon discharge. They have his demographics and sister's contact information. I have sent staff message to admitting hospitalist as well to help coordinate.  Nobie Putnam, DO [facility] 11/02/2017, 12:44 PM

## 2017-11-02 NOTE — H&P (Signed)
[facility] [place] NAME: Shane Holland    MR#:  [id number]  DATE OF BIRTH:  29-Jun-1966  DATE OF ADMISSION:  11/02/2017  PRIMARY CARE PHYSICIAN: Olin Hauser, DO   REQUESTING/REFERRING PHYSICIAN: Dr. Cherylann Banas  CHIEF COMPLAINT:   Chief Complaint  Patient presents with  . Shortness of Breath    HISTORY OF PRESENT ILLNESS:  Shane Holland  is a 52 y.o. male with a known history of stage IV thymic cancer, schizoaffective disorder, bipolar disorder on hospice in a group home presents to the emergency room due to worsening shortness of breath.  Patient at baseline but is 4.5 L oxygen.  Now he is on 5 L oxygen.  Tachycardic.  Chest x-ray showing large left pleural effusion but right mediastinal shift.  Reviewed patient's oncologist notes from 08/2017 when his tumor had significant progression in spite of being on immunotherapy and prior chemotherapy.  Third line chemotherapy was considered but after discussing with patient he was thought to be a poor candidate and treatment stopped and patient was started on home hospice.  PAST MEDICAL HISTORY:   Past Medical History:  Diagnosis Date  . Anxiety   . Asthma   . Bipolar 1 disorder ([place]) unk  . Depression   . GERD (gastroesophageal reflux disease)   . Hypercholesteremia unk  . Liver disease   . Schizo affective schizophrenia ([place]) unk    PAST SURGICAL HISTORY:   Past Surgical History:  Procedure Laterality Date  . CHEST WALL BIOPSY      SOCIAL HISTORY:   Social History   Tobacco Use  . Smoking status: Former Smoker    Packs/day: 1.00    Last attempt to quit: 10/28/2017    Years since quitting: 0.0  . Smokeless tobacco: Current User  Substance Use Topics  . Alcohol use: Yes    Comment: in past    FAMILY HISTORY:   Family History  Problem Relation Age of Onset  . Hypertension Mother   . Diabetes Sister     DRUG ALLERGIES:  No Known Allergies  REVIEW OF  SYSTEMS:   Review of Systems  Constitutional: Positive for malaise/fatigue and weight loss. Negative for chills and fever.  HENT: Negative for sore throat.   Eyes: Negative for blurred vision, double vision and pain.  Respiratory: Positive for cough, shortness of breath and wheezing. Negative for hemoptysis.   Cardiovascular: Positive for chest pain. Negative for palpitations, orthopnea and leg swelling.  Gastrointestinal: Negative for abdominal pain, constipation, diarrhea, heartburn, nausea and vomiting.  Genitourinary: Negative for dysuria and hematuria.  Musculoskeletal: Negative for back pain and joint pain.  Skin: Negative for rash.  Neurological: Negative for sensory change, speech change, focal weakness and headaches.  Endo/Heme/Allergies: Does not bruise/bleed easily.  Psychiatric/Behavioral: Negative for depression. The patient is nervous/anxious.     MEDICATIONS AT HOME:   Prior to Admission medications   Medication Sig Start Date End Date Taking? Authorizing Provider  gabapentin (NEURONTIN) 300 MG capsule Take 300 mg by mouth 3 (three) times daily. 09/26/17 09/26/18 Yes [provider]  LORazepam (ATIVAN) 0.5 MG tablet Take 0.5 mg by mouth every 4 (four) hours as needed for anxiety.   Yes [provider]  naproxen sodium (ALEVE) 220 MG tablet Take 220 mg by mouth 2 (two) times daily as needed.   Yes [provider]  Omega-3 1000 MG CAPS Take 1 capsule by mouth daily.    Yes [provider]  omeprazole (PRILOSEC) 40 MG capsule Take 40 mg by mouth daily as needed.    Yes [provider]  Heparin Lock Flush (HEPARIN FLUSH, PORCINE,) 100 UNIT/ML injection Inject into the vein.    [provider]  ondansetron (ZOFRAN-ODT) 4 MG disintegrating tablet Take 4 mg by mouth every 8 (eight) hours as needed for nausea or vomiting.     [provider]     VITAL SIGNS:  Blood pressure 104/79, pulse (!) 119, temperature 98.2 F  (36.8 C), temperature source Oral, resp. rate 16, height 5\' 8"  (1.727 m), weight 78 kg (172 lb), SpO2 100 %.  PHYSICAL EXAMINATION:  Physical Exam  GENERAL:  52 y.o.-year-old patient lying in the bed with respiratory distress EYES: Pupils equal, round, reactive to light and accommodation. No scleral icterus. Extraocular muscles intact.  HEENT: Head atraumatic, normocephalic. Oropharynx and nasopharynx clear. No oropharyngeal erythema, moist oral mucosa  NECK:  Supple, no jugular venous distention. No thyroid enlargement, no tenderness.  LUNGS: Bilateral wheezing right greater than left.  Significantly decreased breath sounds on the left side. CARDIOVASCULAR: S1, S2 normal. No murmurs, rubs, or gallops.  ABDOMEN: Soft, nontender, nondistended. Bowel sounds present. No organomegaly or mass.  EXTREMITIES: No pedal edema, cyanosis, or clubbing. + 2 pedal & radial pulses b/l.   NEUROLOGIC: Cranial nerves II through XII are intact. No focal Motor or sensory deficits appreciated b/l PSYCHIATRIC: The patient is alert and oriented x 3. Good affect.  SKIN: Skin mass on anterior chest wall  LABORATORY PANEL:   CBC Recent Labs  Lab 11/02/17 0847  WBC 11.8*  HGB 13.2  HCT 38.0*  PLT 303   ------------------------------------------------------------------------------------------------------------------  Chemistries  Recent Labs  Lab 11/02/17 0847  NA 129*  K 4.6  CL 91*  CO2 28  GLUCOSE 128*  BUN 23*  CREATININE 0.75  CALCIUM 9.1   ------------------------------------------------------------------------------------------------------------------  Cardiac Enzymes Recent Labs  Lab 11/02/17 0847  TROPONINI <0.03   ------------------------------------------------------------------------------------------------------------------  RADIOLOGY:  Dg Chest Portable 1 View  Result Date: 11/02/2017 CLINICAL DATA:  Difficulty breathing EXAM: PORTABLE CHEST 1 VIEW COMPARISON:  Chest x-ray  of 11/07/2014 FINDINGS: There is complete whiteout of the left hemithorax with some mediastinal shift to the right. Therefore this most likely represents a combination of with atelectasis and large left pleural effusion. CT may be helpful to assess further. The right lung is grossly clear. Heart size is difficult to assess. Left IJ central venous line tip overlies the expected SVC-RA junction. No acute bony abnormality is seen. IMPRESSION: 1. Whiteout of the left hemithorax consistent with atelectasis and probable large left effusion creating some mediastinal shift to the right. 2. Minimal atelectasis at the right lung base. 3. Left IJ central venous line tip overlies the expected SVC-RA junction. Electronically Signed   By: Ivar Drape M.D.   On: 11/02/2017 09:26     IMPRESSION AND PLAN:   *Large left pleural effusion causing mediastinal shift.  Likely malignant.  Patient has stage IV thymic cancer in the mediastinal area with a left lung nodule and his last scan in [place].  Will order a palliative thoracentesis.  Discussed with ultrasound department.  Labs ordered.  May need a Pleurx catheter placement if this reoccurs for symptom control.  *Stage IV thymic cancer.  Patient has gone through chemotherapy and later [facility]  He had significant disease progression while on immunotherapy and decision was made to stop treatment at that point after  discussing with the patient.  Hospice at home.  *Schizoaffective disorder and bipolar disorder.  Not on medications at home.  DVT prophylaxis with Lovenox  All the records are reviewed and case discussed with ED provider. Management plans discussed with the patient, family and they are in agreement.  CODE STATUS: DO NOT RESUSCITATE  TOTAL TIME TAKING CARE OF THIS PATIENT: 35 minutes.   Leia Alf Alejos Reinhardt M.D on 11/02/2017 at 11:20 AM  Between 7am to 6pm - Pager - [phone]  After 6pm go to  [email or web address] - password [email or web address] [facility]  Office  [phone]  CC: Primary care physician; Olin Hauser, DO  Note: This dictation was prepared with Dragon dictation along with smaller phrase technology. Any transcriptional errors that result from this process are unintentional.

## 2017-11-02 NOTE — Progress Notes (Signed)
Advance care planning  Purpose of Encounter  New large left pleural effusion which is likely malignant.  Stage IV thymic cancer.  CODE STATUS discussion.  Parties in Attendance Patient, healthcare power of attorney his sister Hershal Eriksson  Patients Decisional capacity Patient able to make medical decisions but seems to depend heavily on his sister/healthcare power of attorney.  Was diagnosed with his thymic cancer when a anterior chest wall mass was found in 2017.  He had a biopsy.  Later had chemotherapy.  Followed by immunotherapy.  Repeat CT scans showed significant disease progression in spite of ongoing treatment.  At this point a third line of chemotherapy was considered but after discussing with patient and his prognosis this was not started.  Patient has recently moved to [facility] to a group home.  Has hospice services following.  Recently patient has been asking to have repeat CT scans to see how his tumor is doing.  This request was made to his primary care physician. I discussed with sister and patient and explained the oncologist's notes from February 2019.  At this point they do not want any repeat CT scans.  We discussed about palliative thoracentesis for symptom management.  I explained that this is likely malignant and could recur.  At that point Pleurx catheter can be considered depending on patient's improvement.  We will continue morphine as needed for his ongoing shortness of breath.  Poor prognosis explained.  Joseph Art tells me that patient would be moving in with her at discharge and will continue hospice at home.  Goals of care determination Poor prognosis with less than 6 months of life expectancy with his stage IV thymic cancer  DO NOT RESUSCITATE and DO NOT INTUBATE  Time spent -30 minutes

## 2017-11-03 ENCOUNTER — Encounter: Payer: Self-pay | Admitting: Internal Medicine

## 2017-11-03 LAB — CBC
HCT: 33.5 % — ABNORMAL LOW (ref 40.0–52.0)
Hemoglobin: 11.8 g/dL — ABNORMAL LOW (ref 13.0–18.0)
MCH: 33.4 pg (ref 26.0–34.0)
MCHC: 35.2 g/dL (ref 32.0–36.0)
MCV: 95.1 fL (ref 80.0–100.0)
PLATELETS: 260 10*3/uL (ref 150–440)
RBC: 3.53 MIL/uL — AB (ref 4.40–5.90)
RDW: 13 % (ref 11.5–14.5)
WBC: 12.6 10*3/uL — ABNORMAL HIGH (ref 3.8–10.6)

## 2017-11-03 LAB — BASIC METABOLIC PANEL
Anion gap: 6 (ref 5–15)
BUN: 19 mg/dL (ref 6–20)
CHLORIDE: 98 mmol/L — AB (ref 101–111)
CO2: 30 mmol/L (ref 22–32)
CREATININE: 0.61 mg/dL (ref 0.61–1.24)
Calcium: 8.2 mg/dL — ABNORMAL LOW (ref 8.9–10.3)
Glucose, Bld: 90 mg/dL (ref 65–99)
Potassium: 4.4 mmol/L (ref 3.5–5.1)
SODIUM: 134 mmol/L — AB (ref 135–145)

## 2017-11-03 LAB — CYTOLOGY - NON PAP

## 2017-11-03 NOTE — Clinical Social Work Note (Signed)
CSW spoke with patient's sister Joseph Art and she expressed that she wanted a different hospice agency.  Case manager spoke with patient's sister and switched agencies to [place].  CSW spoke to patient and patient stated that his sister is trying to help find him a different family care home, but he is planning to return back to his current group home.  Patient's sister will be picking him up today and he will be staying with her over the weekend.  Patient's sister reports that there is a concentrator and an extra oxygen tank at her house.  Patient is aware that he will be staying with his sister this weekend.  CSW attempted to contact family care home owner Nance Pear, [phone] to inform him that patient will be discharging today, and left a message on voice mail.  Jones Broom. [place], MSW, [place]  11/03/2017 2:04 PM

## 2017-11-03 NOTE — Progress Notes (Addendum)
New referral for [facility] services at home received from [facility]. Patient is a 52 year old man with a known history of stage IV thymic cancer, schizoaffective disorder, bipolar disorder . He came to the [facility] ED on 5/2 for evaluation of increased shortness of breath. Thoracentesis was performed and 2.9 liters of fluid were removed. Of note patient was being followed at his group home by [facility], per [facility], patient's sister Aron Inge wished to change agencies. Writer spoke with both Joseph Art and Tavin. Several phone calls took place to obtain the correct address for the group home that Sabas lives in. Renee currently has oxygen in her home from the group home, she plans to pick Timoteo up today and have him at her house over the weekend. Oxygen has been ordered to be delivered to the Group home on Monday. All patient information and contact numbers faxed to referral. Hospital care team updated. Flo Shanks RN, BSN, [facility] [place], hospital Liaison [phone]

## 2017-11-03 NOTE — Care Management (Addendum)
Phone number for sister Debroah Baller has been corrected.  Shane Holland says that she wants hospice through [facility] and not [facility] because when the time comes it will be easier to get him to the hospice home.   When asked for an address where services would be provided she replied that going home with her would only be temporary- that she is disabled and not able to provide physical care or keep him from falling.  Discussed that going home with her may not be the best option if she is unable to provide the hands on care.  She relays that the facility 'takes him out every day and he is not able to go".  Suggested discussing this with the fch owner and could report concerns to DSS.  CM spoke with Nance Pear.  He reports that patient does not leave the home everyday while staff running errands.  There are available caregivers in the facility to provide care if other residents and staff are gone. Elberta Fortis says that patient care needs can be met but he must have hospice services.  Informed him a referral is being made to [facility].  Found that patient has only been in this home for one month. Elberta Fortis was informed by [facility] yesterday that agency has terminated services.   Referral called to [facility]. Patient will require oxygen and a hospital bed. Renee took patient's oxygen and medications to her home.  She will have to return to medications to the facility.  Confirmed with [facility] that services have been terminated.

## 2017-11-03 NOTE — NC FL2 (Signed)
[place] LEVEL OF CARE SCREENING TOOL     IDENTIFICATION  Patient Name: Shane Holland Birthdate: March 05, 1966 Sex: male Admission Date (Current Location): 11/02/2017  [place] and [place] Number:  Shane Holland [id number] [place] and Address:  [facility], [address] [place], [place] [address]      Provider Number: [id number]  Attending Physician Name and Address:  Vaughan Basta, *  Relative Name and Phone Number:  Shane Holland Sister   [phone] or Shane Holland Other   [phone]     Current Level of Care: Hospital Recommended Level of Care: Family Care Home Prior Approval Number:    Date Approved/Denied:   PASRR Number:    Discharge Plan: Domiciliary (Rest home)([name])    Current Diagnoses: Patient Active Problem List   Diagnosis Date Noted  . Pleural effusion on left 11/02/2017  . Schizo affective schizophrenia ([place]) 10/03/2017  . Insomnia 10/03/2017  . 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 Hospice care patient 10/03/2017  . Hypercholesteremia 10/03/2017    Orientation RESPIRATION BLADDER Height & Weight     Self, Time, Situation, Place  O2(3L) Continent Weight: 171 lb (77.6 kg) Height:  5\' 8"  (172.7 cm)  BEHAVIORAL SYMPTOMS/MOOD NEUROLOGICAL BOWEL NUTRITION STATUS      Continent Diet(Regular diet)  AMBULATORY STATUS COMMUNICATION OF NEEDS Skin   Independent Verbally ([place])                       Personal Care Assistance Level of Assistance  Dressing, Feeding, Bathing Bathing Assistance: Independent Feeding assistance: Independent Dressing Assistance: Independent     Functional Limitations Info  Sight, Hearing, Speech Sight Info: Adequate Hearing Info: Adequate Speech Info: Adequate    SPECIAL CARE FACTORS FREQUENCY                       Contractures Contractures Info: Not present    Additional Factors Info  Code Status, Allergies Code Status  Info: DNR Allergies Info: NKA           Current Medications (11/03/2017):  This is the current hospital active medication list Current Facility-Administered Medications  Medication Dose Route Frequency Provider Last Rate Last Dose  . acetaminophen (TYLENOL) tablet 650 mg  650 mg Oral Q6H PRN Hillary Bow, MD       Or  . acetaminophen (TYLENOL) suppository 650 mg  650 mg Rectal Q6H PRN Sudini, Srikar, MD      . albuterol (PROVENTIL) (2.5 MG/3ML) 0.083% nebulizer solution 2.5 mg  2.5 mg Nebulization Q2H PRN Sudini, Srikar, MD      . docusate sodium (COLACE) capsule 100 mg  100 mg Oral BID Hillary Bow, MD   100 mg at 11/03/17 0954  . enoxaparin (LOVENOX) injection 40 mg  40 mg Subcutaneous Q24H Hillary Bow, MD   40 mg at 11/02/17 2035  . gabapentin (NEURONTIN) capsule 300 mg  300 mg Oral TID Hillary Bow, MD   300 mg at 11/03/17 0954  . LORazepam (ATIVAN) tablet 0.5 mg  0.5 mg Oral Q4H PRN Sudini, Alveta Heimlich, MD      . metaxalone [facility]) tablet 400 mg  400 mg Oral TID Harrie Foreman, MD   400 mg at 11/03/17 0955  . morphine CONCENTRATE 10 MG/0.5ML oral solution 5 mg  5 mg Oral Q2H PRN Sudini, Alveta Heimlich, MD      . ondansetron (ZOFRAN) tablet 4 mg  4 mg Oral Q6H PRN Hillary Bow, MD  Or  . ondansetron (ZOFRAN) injection 4 mg  4 mg Intravenous Q6H PRN Sudini, Srikar, MD      . pantoprazole (PROTONIX) EC tablet 40 mg  40 mg Oral Daily Hillary Bow, MD   40 mg at 11/03/17 0954  . polyethylene glycol (MIRALAX / GLYCOLAX) packet 17 g  17 g Oral Daily PRN Hillary Bow, MD         Discharge Medications: TAKE these medications   gabapentin 300 MG capsule Commonly known as:  NEURONTIN Take 300 mg by mouth 3 (three) times daily.   heparin flush (porcine) 100 UNIT/ML injection Inject into the vein.   LORazepam 0.5 MG tablet Commonly known as:  ATIVAN Take 0.5 mg by mouth every 4 (four) hours as needed for anxiety.   naproxen sodium 220 MG tablet Commonly known as:   ALEVE Take 220 mg by mouth 2 (two) times daily as needed.   Omega-3 1000 MG Caps Take 1 capsule by mouth daily.   omeprazole 40 MG capsule Commonly known as:  PRILOSEC Take 40 mg by mouth daily as needed.   ondansetron 4 MG disintegrating tablet Commonly known as:  ZOFRAN-ODT Take 4 mg by mouth every 8 (eight) hours as needed for nausea or vomiting.      Relevant Imaging Results:  Relevant Lab Results:   Additional Information SSN [id number]  Shane Holland, [place]

## 2017-11-03 NOTE — Discharge Summary (Signed)
[name]    MR#:  [id number]  DATE OF BIRTH:  [birth year]  DATE OF ADMISSION:  11/02/2017 ADMITTING PHYSICIAN: Hillary Bow, MD  DATE OF DISCHARGE: 11/03/2017  PRIMARY CARE PHYSICIAN: Olin Hauser, DO    ADMISSION DIAGNOSIS:  Shortness of breath [R06.02] Pleural effusion [J90] Hypoxia [R09.02] Pleural effusion, left [J90]  DISCHARGE DIAGNOSIS:  Active Problems:   Pleural effusion on left   SECONDARY DIAGNOSIS:   Past Medical History:  Diagnosis Date  . Anxiety   . Asthma   . Bipolar 1 disorder ([place]) unk  . Cancer [facility])    metastatic cancer of thymus  . Depression   . GERD (gastroesophageal reflux disease)   . Hypercholesteremia unk  . Liver disease   . Schizo affective schizophrenia [facility]) unk    HOSPITAL COURSE:   *Large left pleural effusion causing mediastinal shift.  Likely malignant.  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  Will order a palliative thoracentesis.  2.9 Ltr bloody looking fluid removed, Pt feels much better, now on 3 ltr oxygen.  *Stage IV thymic cancer.  Patient has gone through chemotherapy and later [facility].  He had significant disease progression while on immunotherapy and decision was made to stop treatment at that point after discussing with the patient.  Hospice at home.  *Schizoaffective disorder and bipolar disorder.  Not on medications at home.  DVT prophylaxis with Lovenox   Discharge back to his group home.  DISCHARGE CONDITIONS:   Stable.  CONSULTS OBTAINED:    DRUG ALLERGIES:  No Known Allergies  DISCHARGE MEDICATIONS:   Allergies as of 11/03/2017   No Known Allergies     Medication List    TAKE these medications   gabapentin 300 MG capsule Commonly known as:  NEURONTIN Take 300 mg  by mouth 3 (three) times daily.   heparin flush (porcine) 100 UNIT/ML injection Inject into the vein.   LORazepam 0.5 MG tablet Commonly known as:  ATIVAN Take 0.5 mg by mouth every 4 (four) hours as needed for anxiety.   naproxen sodium 220 MG tablet Commonly known as:  ALEVE Take 220 mg by mouth 2 (two) times daily as needed.   Omega-3 1000 MG Caps Take 1 capsule by mouth daily.   omeprazole 40 MG capsule Commonly known as:  PRILOSEC Take 40 mg by mouth daily as needed.   ondansetron 4 MG disintegrating tablet Commonly known as:  ZOFRAN-ODT Take 4 mg by mouth every 8 (eight) hours as needed for nausea or vomiting.        DISCHARGE INSTRUCTIONS:    Follow with PMD in 1-2 weeks.  If you experience worsening of your admission symptoms, develop shortness of breath, life threatening emergency, suicidal or homicidal thoughts you must seek medical attention immediately by calling 911 or calling your MD immediately  if symptoms less severe.  You Must read complete instructions/literature along with all the possible adverse reactions/side effects for all the Medicines you take and that have been prescribed to you. Take any new Medicines after you have completely understood and accept all the possible adverse reactions/side effects.   Please note  You were cared for by a hospitalist during your hospital stay. If you have any questions about your discharge medications or the care you received while you were  in the hospital after you are discharged, you can call the unit and asked to speak with the hospitalist on call if the hospitalist that took care of you is not available. Once you are discharged, your primary care physician will handle any further medical issues. Please note that NO REFILLS for any discharge medications will be authorized once you are discharged, as it is imperative that you return to your primary care physician (or establish a relationship with a primary care  physician if you do not have one) for your aftercare needs so that they can reassess your need for medications and monitor your lab values.    Today   CHIEF COMPLAINT:   Chief Complaint  Patient presents with  . Shortness of Breath    HISTORY OF PRESENT ILLNESS:  Shane Holland  is a 52 y.o. male with a known history of stage IV thymic cancer, schizoaffective disorder, bipolar disorder on hospice in a group home presents to the emergency room due to worsening shortness of breath.  Patient at baseline but is 4.5 L oxygen.  Now he is on 5 L oxygen.  Tachycardic.  Chest x-ray showing large left pleural effusion but right mediastinal shift.  Reviewed patient's oncologist notes from 08/2017 when his tumor had significant progression in spite of being on immunotherapy and prior chemotherapy.  Third line chemotherapy was considered but after discussing with patient he was thought to be a poor candidate and treatment stopped and patient was started on home hospice.   VITAL SIGNS:  Blood pressure (!) 114/52, pulse 84, temperature (!) 97.5 F (36.4 C), temperature source Oral, resp. rate 18, height 5\' 8"  (1.727 m), weight 77.6 kg (171 lb), SpO2 99 %.  I/O:    Intake/Output Summary (Last 24 hours) at 11/03/2017 1043 Last data filed at 11/03/2017 0912 Gross per 24 hour  Intake 240 ml  Output 2025 ml  Net -1785 ml    PHYSICAL EXAMINATION:  GENERAL:  52 y.o.-year-old patient lying in the bed with no acute distress.  EYES: Pupils equal, round, reactive to light and accommodation. No scleral icterus. Extraocular muscles intact.  HEENT: Head atraumatic, normocephalic. Oropharynx and nasopharynx clear.  NECK:  Supple, no jugular venous distention. No thyroid enlargement, no tenderness.  LUNGS: Decreased air entry on left side, no wheezing, rales,rhonchi or crepitation. No use of accessory muscles of respiration.  CARDIOVASCULAR: S1, S2 normal. No murmurs, rubs, or gallops.  ABDOMEN: Soft,  non-tender, non-distended. Bowel sounds present. No organomegaly or mass.  EXTREMITIES: No pedal edema, cyanosis, or clubbing.  NEUROLOGIC: Cranial nerves II through XII are intact. Muscle strength 5/5 in all extremities. Sensation intact. Gait not checked.  PSYCHIATRIC: The patient is alert and oriented x 3.  SKIN: No obvious rash, lesion, or ulcer.   DATA REVIEW:   CBC Recent Labs  Lab 11/03/17 0433  WBC 12.6*  HGB 11.8*  HCT 33.5*  PLT 260    Chemistries  Recent Labs  Lab 11/03/17 0433  NA 134*  K 4.4  CL 98*  CO2 30  GLUCOSE 90  BUN 19  CREATININE 0.61  CALCIUM 8.2*    Cardiac Enzymes Recent Labs  Lab 11/02/17 0847  TROPONINI <0.03    Microbiology Results  Results for orders placed or performed during the hospital encounter of 11/02/17  Body fluid culture     Status: None (Preliminary result)   Collection Time: 11/02/17  3:06 PM  Result Value Ref Range Status   Specimen Description   Final  PLEURAL Performed at [facility], [address] [place], [place] [address]    Special Requests   Final    Immunocompromised Performed at [facility], [place] [place], [place] [address]    Gram Stain   Final    MODERATE WBC PRESENT,BOTH PMN AND MONONUCLEAR NO ORGANISMS SEEN Performed at [facility], [place] [address], [place], [place] [address]    Culture PENDING  Incomplete   Report Status PENDING  Incomplete    RADIOLOGY:  Dg Chest 1 View  Result Date: 11/02/2017 CLINICAL DATA:  Left thoracentesis. EXAM: CHEST  1 VIEW COMPARISON:  11/02/2017. FINDINGS: PowerPort catheter noted with tip over the proximal right atrium. Opacification of the left hemidiaphragm consistent with previously identified large left pleural effusion and possible underlying pulmonary disease again noted. Some resolution is noted status post thoracentesis. No pneumothorax. Mild infiltrate right mid lung and right base may be present. No acute bony  abnormality. IMPRESSION: 1. No pneumothorax post thoracentesis. Persistent left-sided pleural effusion with probable underlying atelectasis and consolidation. 2.  Mild lung infiltrate right mid and right lung base. Electronically Signed   By: Marcello Moores  Register   On: 11/02/2017 14:47   Dg Chest Portable 1 View  Result Date: 11/02/2017 CLINICAL DATA:  Difficulty breathing EXAM: PORTABLE CHEST 1 VIEW COMPARISON:  Chest x-ray of 11/07/2014 FINDINGS: There is complete whiteout of the left hemithorax with some mediastinal shift to the right. Therefore this most likely represents a combination of with atelectasis and large left pleural effusion. CT may be helpful to assess further. The right lung is grossly clear. Heart size is difficult to assess. Left IJ central venous line tip overlies the expected SVC-RA junction. No acute bony abnormality is seen. IMPRESSION: 1. Whiteout of the left hemithorax consistent with atelectasis and probable large left effusion creating some mediastinal shift to the right. 2. Minimal atelectasis at the right lung base. 3. Left IJ central venous line tip overlies the expected SVC-RA junction. Electronically Signed   By: Ivar Drape M.D.   On: 11/02/2017 09:26   US Thoracentesis Asp Pleural Space W/img Guide  Result Date: 11/02/2017 CLINICAL DATA:  History of stage IV metastatic thymic carcinoma. Shortness of breath with oxygen requirement and chest x-ray evidence of large left pleural effusion. EXAM: ULTRASOUND GUIDED LEFT THORACENTESIS COMPARISON:  Chest x-ray earlier today on 11/02/2017 PROCEDURE: An ultrasound guided thoracentesis was thoroughly discussed with the patient and questions answered. The benefits, risks, alternatives and complications were also discussed. The patient understands and wishes to proceed with the procedure. Written consent was obtained. Ultrasound was performed to localize and mark an adequate pocket of fluid in the left chest. The area was then prepped and  draped in the normal sterile fashion. 1% Lidocaine was used for local anesthesia. Under ultrasound guidance a 6 French Safe-T-Centesis catheter was introduced. Thoracentesis was performed. The catheter was removed and a dressing applied. COMPLICATIONS: None FINDINGS: A total of approximately 2.9 L of grossly bloody fluid was removed. A fluid sample was sent for laboratory analysis. IMPRESSION: Successful ultrasound guided left thoracentesis yielding 2.9 L of bloody pleural fluid. Electronically Signed   By: Aletta Edouard M.D.   On: 11/02/2017 15:18    EKG:   Orders placed or performed during the hospital encounter of 11/02/17  . EKG 12-Lead  . EKG 12-Lead  . ED EKG  . ED EKG      Management plans discussed with the patient, family and they are in agreement.  CODE STATUS:     Code Status Orders  (From admission, onward)        Start     Ordered   11/02/17 1119  Do not attempt resuscitation (DNR)  Continuous    Question Answer Comment  In the event of cardiac or respiratory ARREST Do not call a "code blue"   In the event of cardiac or respiratory ARREST Do not perform Intubation, CPR, defibrillation or ACLS   In the event of cardiac or respiratory ARREST Use medication by any route, position, wound care, and other measures to relive pain and suffering. May use oxygen, suction and manual treatment of airway obstruction as needed for comfort.      11/02/17 1119    Code Status History    This patient has a current code status but no historical code status.      TOTAL TIME TAKING CARE OF THIS PATIENT: 35 minutes.    Vaughan Basta M.D on 11/03/2017 at 10:43 AM  Between 7am to 6pm - Pager - [phone]  After 6pm go to [email or web address] - password [email or web address] [facility]  Office  [phone]  CC: Primary care physician; Olin Hauser, DO   Note: This dictation was prepared with Dragon dictation along with smaller phrase technology. Any  transcriptional errors that result from this process are unintentional.

## 2017-11-03 NOTE — Care Management (Signed)
It has been reported to this CM that patient to discharge to the home of his sister.  Phone number for sister Joseph Art on face sheet has been disconnected.

## 2017-11-06 LAB — BODY FLUID CULTURE: Culture: NO GROWTH

## 2017-11-08 ENCOUNTER — Inpatient Hospital Stay: Payer: Medicaid Other | Admitting: Family Medicine

## 2017-11-09 ENCOUNTER — Telehealth: Payer: Self-pay | Admitting: Licensed Clinical Social Worker

## 2017-11-09 NOTE — Telephone Encounter (Signed)
EMMI flagged patient for answering yes to feeling sad/hopeless/anxouis/empty. Clinical Education officer, museum (CSW) contacted patient's sister Joseph Art who's number was listed on the EMMI report. Renee reported that patient is now home at her house nearing end of life and is on [facility] care. Per Renee patient is now getting morphine every 1 hour under hospice supervision. Per Renee patient is not able to talk on the phone so she did the follow up call. Renee reported that she is fine and will reach out to the [facility] team if she needs anything. CSW provided emotional support. No future call is needed.   [employer], LCSW [phone]

## 2017-11-13 ENCOUNTER — Telehealth: Payer: Self-pay | Admitting: Family Medicine

## 2017-11-13 NOTE — Telephone Encounter (Signed)
We received notification by phone today from patient's sister, Joseph Art, that Shane Holland has passed away, while he was under hospice care, [facility].  We will request copy of record / notification from hospice of patient's passing.  Nobie Putnam, DO [facility] 11/13/2017, 1:16 PM

## 2017-11-14 ENCOUNTER — Ambulatory Visit: Payer: Medicaid Other | Admitting: Family Medicine

## 2017-12-02 DEATH — deceased

## 2019-08-31 IMAGING — US US THORACENTESIS ASP PLEURAL SPACE W/IMG GUIDE
1 series · 2 of 2 positions shown · non-contrast
Comparison: Chest x-ray earlier today on 11/02/2017

CLINICAL DATA: History of stage IV metastatic thymic carcinoma.
Shortness of breath with oxygen requirement and chest x-ray evidence
of large left pleural effusion.

EXAM:
ULTRASOUND GUIDED LEFT THORACENTESIS

[Series 1: us thoracentesis asp pleural space w/img guide · 2 of 2 slices shown]
[im 1/2]
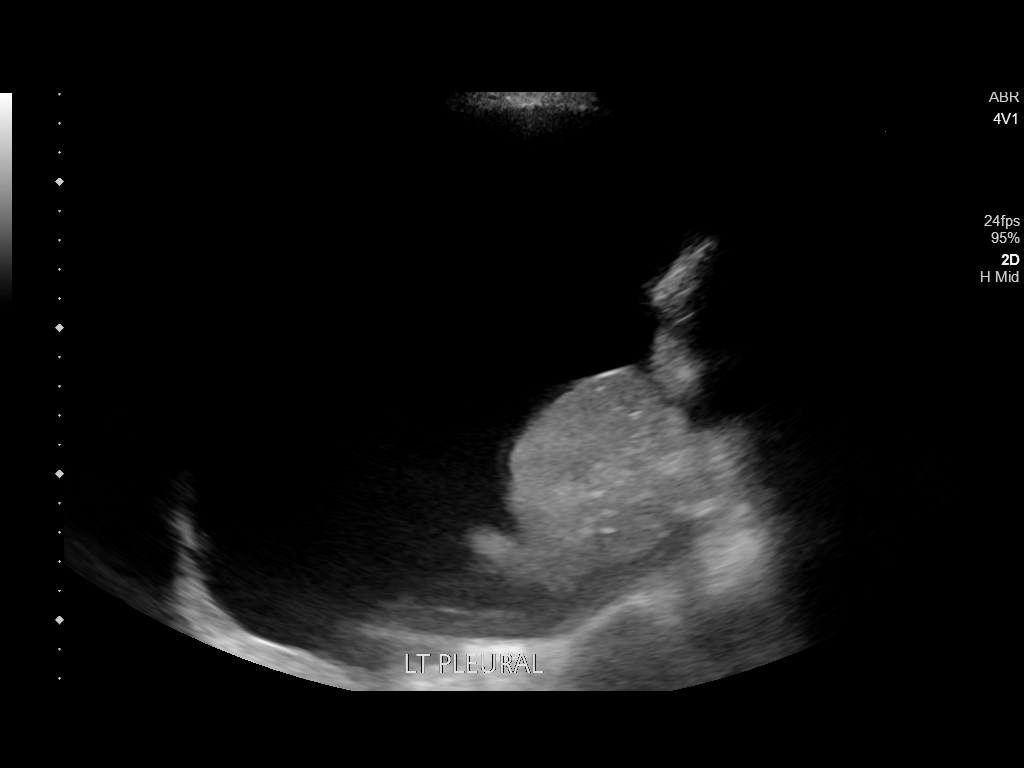
[im 2/2]
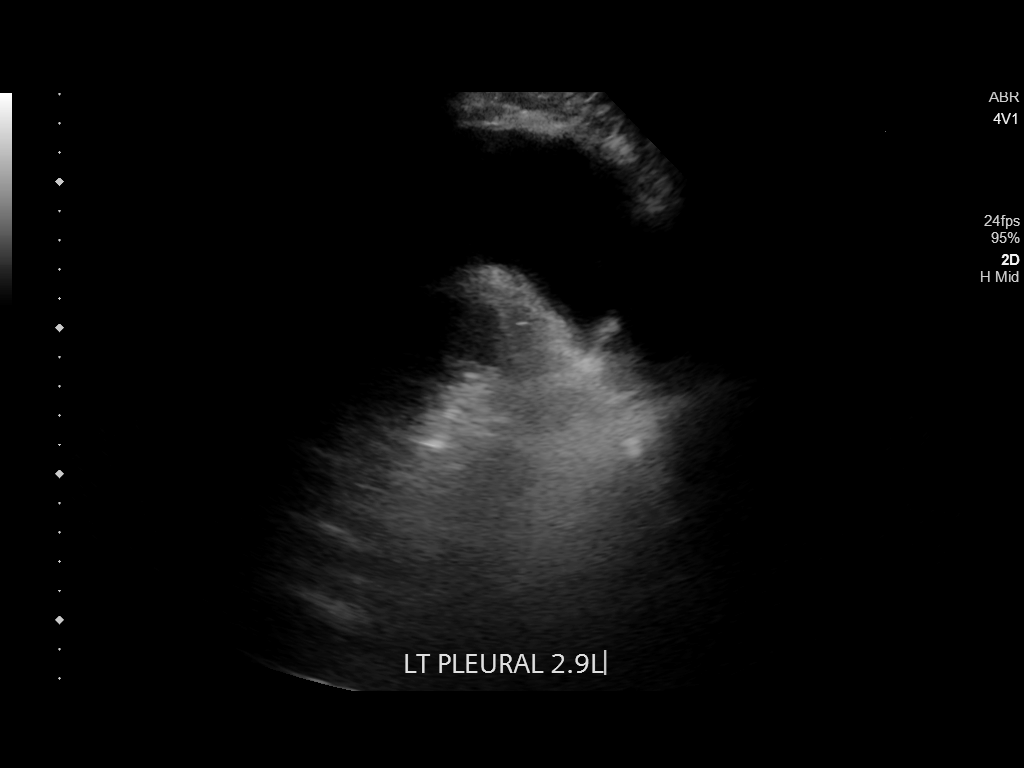

[2 of 2 positions shown; findings below may reference images not displayed]

PROCEDURE:
An ultrasound guided thoracentesis was thoroughly discussed with the
patient and questions answered. The benefits, risks, alternatives
and complications were also discussed. The patient understands and
wishes to proceed with the procedure. Written consent was obtained.

Ultrasound was performed to localize and mark an adequate pocket of
fluid in the left chest. The area was then prepped and draped in the
normal sterile fashion. 1% Lidocaine was used for local anesthesia.
Under ultrasound guidance a 6 French Safe-T-Centesis catheter was
introduced. Thoracentesis was performed. The catheter was removed
and a dressing applied.

COMPLICATIONS:
None
FINDINGS: A total of approximately 2.9 L of grossly bloody fluid was removed.
A fluid sample was sent for laboratory analysis.
IMPRESSION: Successful ultrasound guided left thoracentesis yielding 2.9 L of
bloody pleural fluid.

## 2019-08-31 IMAGING — CR DG CHEST 1V
1 series · 1 of 1 positions shown · non-contrast
Comparison: 11/02/2017.

CLINICAL DATA: Left thoracentesis.

EXAM:
CHEST  1 VIEW

[dg chest 1 view]
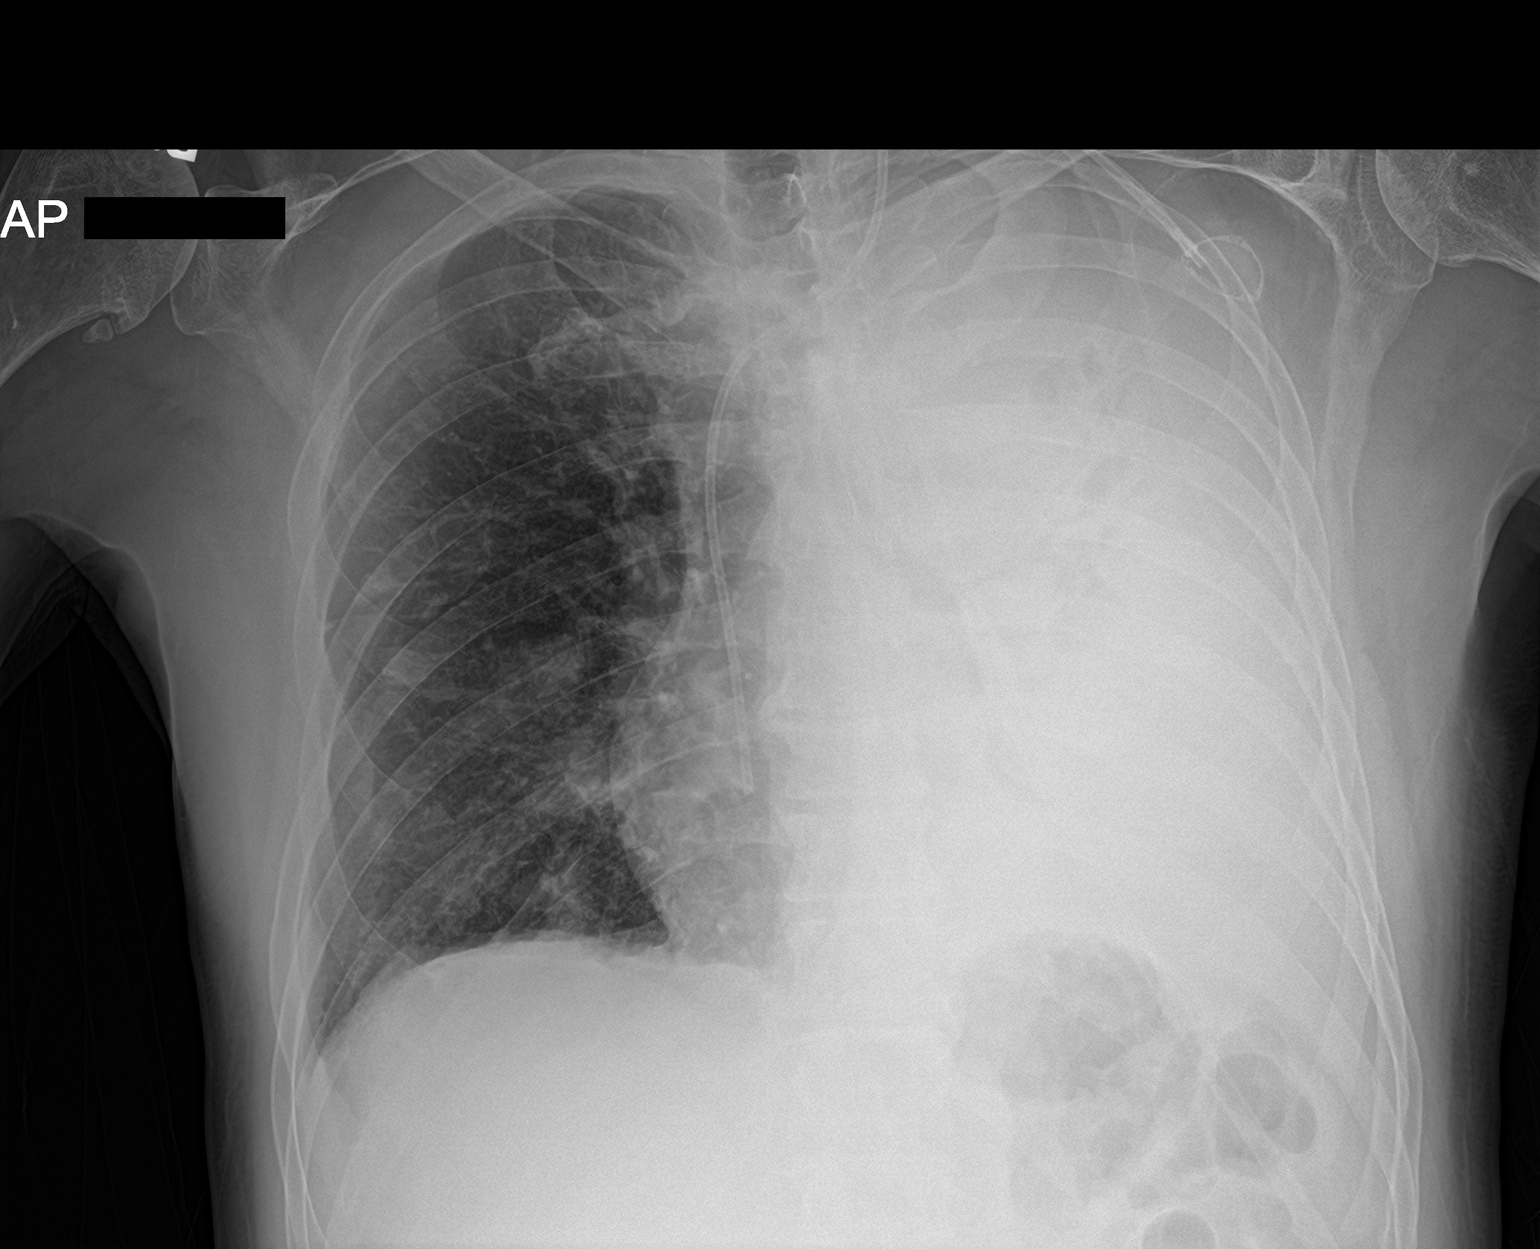

[1 of 1 positions shown; findings below may reference images not displayed]

FINDINGS: PowerPort catheter noted with tip over the proximal right atrium.
Opacification of the left hemidiaphragm consistent with previously
identified large left pleural effusion and possible underlying
pulmonary disease again noted. Some resolution is noted status post
thoracentesis. No pneumothorax. Mild infiltrate right mid lung and
right base may be present. No acute bony abnormality.
IMPRESSION: 1. No pneumothorax post thoracentesis. Persistent left-sided pleural
effusion with probable underlying atelectasis and consolidation.

2.  Mild lung infiltrate right mid and right lung base.
